# Patient Record
Sex: Male | Born: 1970 | Race: Black or African American | Hispanic: No | Marital: Single | Smoking: Never smoker
Health system: Southern US, Community
[De-identification: ages and names within clinical notes are randomized; demographics above are authoritative.]

## PROBLEM LIST (undated history)

## (undated) DIAGNOSIS — I10 Essential (primary) hypertension: Secondary | ICD-10-CM

## (undated) DIAGNOSIS — K859 Acute pancreatitis without necrosis or infection, unspecified: Secondary | ICD-10-CM

---

## 2000-08-20 ENCOUNTER — Emergency Department (HOSPITAL_COMMUNITY): Admission: EM | Admit: 2000-08-20 | Discharge: 2000-08-20 | Payer: Self-pay | Admitting: Emergency Medicine

## 2000-08-20 ENCOUNTER — Encounter: Payer: Self-pay | Admitting: Emergency Medicine

## 2000-11-08 ENCOUNTER — Emergency Department (HOSPITAL_COMMUNITY): Admission: EM | Admit: 2000-11-08 | Discharge: 2000-11-08 | Payer: Self-pay | Admitting: Emergency Medicine

## 2000-11-10 ENCOUNTER — Emergency Department (HOSPITAL_COMMUNITY): Admission: EM | Admit: 2000-11-10 | Discharge: 2000-11-11 | Payer: Self-pay | Admitting: Emergency Medicine

## 2001-01-06 ENCOUNTER — Emergency Department (HOSPITAL_COMMUNITY): Admission: EM | Admit: 2001-01-06 | Discharge: 2001-01-06 | Payer: Self-pay | Admitting: *Deleted

## 2002-01-08 ENCOUNTER — Emergency Department (HOSPITAL_COMMUNITY): Admission: EM | Admit: 2002-01-08 | Discharge: 2002-01-08 | Payer: Self-pay | Admitting: Emergency Medicine

## 2003-05-24 ENCOUNTER — Emergency Department (HOSPITAL_COMMUNITY): Admission: EM | Admit: 2003-05-24 | Discharge: 2003-05-24 | Payer: Self-pay | Admitting: Emergency Medicine

## 2005-10-05 ENCOUNTER — Emergency Department (HOSPITAL_COMMUNITY): Admission: EM | Admit: 2005-10-05 | Discharge: 2005-10-05 | Payer: Self-pay | Admitting: Emergency Medicine

## 2007-03-02 ENCOUNTER — Emergency Department (HOSPITAL_COMMUNITY): Admission: EM | Admit: 2007-03-02 | Discharge: 2007-03-02 | Payer: Self-pay | Admitting: Emergency Medicine

## 2007-08-25 ENCOUNTER — Emergency Department (HOSPITAL_COMMUNITY): Admission: EM | Admit: 2007-08-25 | Discharge: 2007-08-25 | Payer: Self-pay | Admitting: Emergency Medicine

## 2009-10-29 ENCOUNTER — Emergency Department (HOSPITAL_COMMUNITY): Admission: EM | Admit: 2009-10-29 | Discharge: 2009-10-29 | Payer: Self-pay | Admitting: Emergency Medicine

## 2011-01-01 LAB — HEPATIC FUNCTION PANEL
ALT: 26
AST: 24
Albumin: 4.2
Alkaline Phosphatase: 72
Indirect Bilirubin: 1.1 — ABNORMAL HIGH
Total Protein: 8

## 2011-01-01 LAB — CBC
Platelets: 218
RDW: 13.1
WBC: 8.2

## 2011-01-01 LAB — URINE MICROSCOPIC-ADD ON

## 2011-01-01 LAB — POCT I-STAT, CHEM 8
BUN: 11
Chloride: 99
Creatinine, Ser: 1.3
Glucose, Bld: 135 — ABNORMAL HIGH
HCT: 57 — ABNORMAL HIGH
Potassium: 4.5

## 2011-01-01 LAB — URINALYSIS, ROUTINE W REFLEX MICROSCOPIC
Specific Gravity, Urine: 1.037 — ABNORMAL HIGH
Urobilinogen, UA: 0.2
pH: 6

## 2011-01-01 LAB — DIFFERENTIAL
Basophils Absolute: 0
Lymphocytes Relative: 12
Lymphs Abs: 1
Neutro Abs: 6.9
Neutrophils Relative %: 84 — ABNORMAL HIGH

## 2012-05-17 ENCOUNTER — Emergency Department (HOSPITAL_COMMUNITY)
Admission: EM | Admit: 2012-05-17 | Discharge: 2012-05-18 | Disposition: A | Discharge status: Home / Self Care | Payer: Medicaid Other | Attending: Emergency Medicine | Admitting: Emergency Medicine

## 2012-05-17 DIAGNOSIS — R141 Gas pain: Secondary | ICD-10-CM | POA: Insufficient documentation

## 2012-05-17 DIAGNOSIS — R142 Eructation: Secondary | ICD-10-CM | POA: Insufficient documentation

## 2012-05-17 DIAGNOSIS — R110 Nausea: Secondary | ICD-10-CM | POA: Insufficient documentation

## 2012-05-17 DIAGNOSIS — K859 Acute pancreatitis without necrosis or infection, unspecified: Secondary | ICD-10-CM

## 2012-05-17 LAB — COMPREHENSIVE METABOLIC PANEL
ALT: 33 U/L (ref 0–53)
AST: 27 U/L (ref 0–37)
Albumin: 4 g/dL (ref 3.5–5.2)
Calcium: 8.9 mg/dL (ref 8.4–10.5)
Sodium: 135 mEq/L (ref 135–145)
Total Protein: 7.6 g/dL (ref 6.0–8.3)

## 2012-05-17 LAB — CBC WITH DIFFERENTIAL/PLATELET
Basophils Absolute: 0 10*3/uL (ref 0.0–0.1)
Eosinophils Absolute: 0.1 10*3/uL (ref 0.0–0.7)
Eosinophils Relative: 2 % (ref 0–5)
MCH: 27.9 pg (ref 26.0–34.0)
MCV: 85.9 fL (ref 78.0–100.0)
Platelets: 193 10*3/uL (ref 150–400)
RDW: 12.4 % (ref 11.5–15.5)
WBC: 6.9 10*3/uL (ref 4.0–10.5)

## 2012-05-17 MED ORDER — ONDANSETRON HCL 4 MG/2ML IJ SOLN
4.0000 mg | Freq: Once | INTRAMUSCULAR | Status: AC
  Administered 2012-05-17: 4 mg via INTRAVENOUS
  Filled 2012-05-17: qty 2

## 2012-05-17 MED ORDER — OXYCODONE-ACETAMINOPHEN 5-325 MG PO TABS: 1.0000 | ORAL_TABLET | Freq: Four times a day (QID) | ORAL | Status: DC | PRN

## 2012-05-17 MED ORDER — MORPHINE SULFATE 4 MG/ML IJ SOLN
4.0000 mg | Freq: Once | INTRAMUSCULAR | Status: AC
  Administered 2012-05-17: 4 mg via INTRAVENOUS
  Filled 2012-05-17: qty 1

## 2012-05-17 MED ORDER — PROMETHAZINE HCL 25 MG PO TABS: 12.5000 mg | ORAL_TABLET | Freq: Four times a day (QID) | ORAL | Status: DC | PRN

## 2012-05-17 MED ORDER — SODIUM CHLORIDE 0.9 % IV SOLN
Freq: Once | INTRAVENOUS | Status: AC
  Administered 2012-05-17: 22:00:00 via INTRAVENOUS

## 2012-05-17 NOTE — ED Provider Notes (Signed)
History     CSN: [id number]  Arrival date & time 05/17/12  2043   First MD Initiated Contact with Patient 05/17/12 2138      Chief Complaint  Patient presents with  . Abdominal Pain    (Consider location/radiation/quality/duration/timing/severity/associated sxs/prior treatment) HPI Comments: Patient reports that 7 years ago he was told he had an episode of pancreatitis, now with 2 weeks Hx of bilateral upper abdominal pina with nausea.  Has been taking Ibuprofen 1-2 times daily without relief.  Reports drink some form of alcohol on an 5-7 times a week basis.    Patient is a 42 y.o. male presenting with abdominal pain. The history is provided by the patient.  Abdominal Pain Pain location:  LUQ, RUQ and epigastric Pain quality: aching and fullness   Pain radiates to:  Does not radiate Pain severity:  Moderate Onset quality:  Gradual Duration:  2 weeks Timing:  Constant Progression:  Worsening Chronicity:  Recurrent Context: alcohol use   Context: not diet changes, not eating, not medication withdrawal and not retching   Relieved by:  Nothing Worsened by:  Position changes and eating Ineffective treatments:  NSAIDs Associated symptoms: nausea   Associated symptoms: no chest pain, no chills, no constipation, no cough, no diarrhea, no dysuria, no fever, no shortness of breath and no vomiting   Risk factors: alcohol abuse and obesity     No past medical history on file.  No past surgical history on file.  No family history on file.  History  Substance Use Topics  . Smoking status: Not on file  . Smokeless tobacco: Not on file  . Alcohol Use: Not on file      Review of Systems  Constitutional: Negative for fever and chills.  Respiratory: Negative for cough and shortness of breath.   Cardiovascular: Negative for chest pain and leg swelling.  Gastrointestinal: Positive for nausea, abdominal pain and abdominal distention. Negative for vomiting, diarrhea, constipation  and blood in stool.  Genitourinary: Negative for dysuria and flank pain.  Skin: Negative for rash.  Neurological: Negative for weakness.  All other systems reviewed and are negative.    Allergies  Review of patient's allergies indicates no known allergies.  Home Medications   Current Outpatient Rx  Name  Route  Sig  Dispense  Refill  . oxyCODONE-acetaminophen (PERCOCET/ROXICET) 5-325 MG per tablet   Oral   Take 1-2 tablets by mouth every 6 (six) hours as needed for pain.   20 tablet   0   . promethazine (PHENERGAN) 25 MG tablet   Oral   Take 0.5 tablets (12.5 mg total) by mouth every 6 (six) hours as needed for nausea.   30 tablet   0     BP 157/79  Pulse 70  Temp(Src) 98.1 F (36.7 C) (Oral)  Resp 20  SpO2 95%  Physical Exam  Constitutional: He is oriented to person, place, and time. He appears well-developed and well-nourished.  HENT:  Head: Normocephalic.  Eyes: Pupils are equal, round, and reactive to light.  Neck: Normal range of motion.  Cardiovascular: Normal rate.   Pulmonary/Chest: Effort normal and breath sounds normal. No respiratory distress.  Abdominal: Soft. Bowel sounds are normal. He exhibits no distension. There is tenderness in the right upper quadrant, epigastric area and left upper quadrant. There is no rigidity, no rebound and no guarding.  Musculoskeletal: Normal range of motion.  Neurological: He is alert and oriented to person, place, and time.  Skin: Skin is warm.  No rash noted. No pallor.    ED Course  Procedures (including critical care time)  Labs Reviewed  COMPREHENSIVE METABOLIC PANEL - Abnormal; Notable for the following:    Glucose, Bld 135 (*)    All other components within normal limits  LIPASE, BLOOD - Abnormal; Notable for the following:    Lipase 1705 (*)    All other components within normal limits  CBC WITH DIFFERENTIAL  ETHANOL  URINE RAPID DRUG SCREEN (HOSP PERFORMED)  URINALYSIS, ROUTINE W REFLEX MICROSCOPIC    No results found.   1. Pancreatitis       MDM   Patient refusing admission would prefer to try home therapies with the understanding that if he does not improve he will return for admission        Arman Filter, NP 05/17/12 2308

## 2012-05-17 NOTE — ED Notes (Signed)
Pt c/o abd pain days x4. Pt sts his "pancreas is swelling".VSS

## 2012-05-22 NOTE — ED Provider Notes (Signed)
Medical screening examination/treatment/procedure(s) were performed by non-physician practitioner and as supervising physician I was immediately available for consultation/collaboration.  Jones Skene, M.D.     Jones Skene, MD 05/22/12 [phone]

## 2013-03-08 ENCOUNTER — Emergency Department (HOSPITAL_COMMUNITY): Payer: Medicaid Other

## 2013-03-08 ENCOUNTER — Emergency Department (HOSPITAL_COMMUNITY)
Admission: EM | Admit: 2013-03-08 | Discharge: 2013-03-08 | Disposition: A | Discharge status: Home / Self Care | Payer: Medicaid Other | Attending: Emergency Medicine | Admitting: Emergency Medicine

## 2013-03-08 ENCOUNTER — Encounter (HOSPITAL_COMMUNITY): Payer: Self-pay | Admitting: Emergency Medicine

## 2013-03-08 DIAGNOSIS — K859 Acute pancreatitis without necrosis or infection, unspecified: Secondary | ICD-10-CM | POA: Insufficient documentation

## 2013-03-08 DIAGNOSIS — M549 Dorsalgia, unspecified: Secondary | ICD-10-CM | POA: Insufficient documentation

## 2013-03-08 HISTORY — DX: Acute pancreatitis without necrosis or infection, unspecified: K85.90

## 2013-03-08 LAB — CBC WITH DIFFERENTIAL/PLATELET
Eosinophils Relative: 5 % (ref 0–5)
HCT: 39.1 % (ref 39.0–52.0)
Lymphocytes Relative: 23 % (ref 12–46)
Lymphs Abs: 1.4 10*3/uL (ref 0.7–4.0)
MCV: 83.4 fL (ref 78.0–100.0)
Monocytes Absolute: 0.8 10*3/uL (ref 0.1–1.0)
Platelets: 206 10*3/uL (ref 150–400)
RBC: 4.69 MIL/uL (ref 4.22–5.81)
WBC: 6.4 10*3/uL (ref 4.0–10.5)

## 2013-03-08 LAB — URINALYSIS, ROUTINE W REFLEX MICROSCOPIC
Hgb urine dipstick: NEGATIVE
Ketones, ur: NEGATIVE mg/dL
Protein, ur: 100 mg/dL — AB
Urobilinogen, UA: 1 mg/dL (ref 0.0–1.0)

## 2013-03-08 LAB — COMPREHENSIVE METABOLIC PANEL
ALT: 17 U/L (ref 0–53)
CO2: 28 mEq/L (ref 19–32)
Calcium: 9.7 mg/dL (ref 8.4–10.5)
GFR calc Af Amer: >90 mL/min (ref >90)
GFR calc non Af Amer: >90 mL/min (ref >90)
Glucose, Bld: 112 mg/dL — ABNORMAL HIGH (ref 70–99)
Sodium: 134 mEq/L — ABNORMAL LOW (ref 135–145)

## 2013-03-08 LAB — URINE MICROSCOPIC-ADD ON

## 2013-03-08 MED ORDER — SODIUM CHLORIDE 0.9 % IV BOLUS (SEPSIS)
1000.0000 mL | Freq: Once | INTRAVENOUS | Status: AC
  Administered 2013-03-08: 1000 mL via INTRAVENOUS

## 2013-03-08 MED ORDER — HYDROMORPHONE HCL PF 1 MG/ML IJ SOLN
1.0000 mg | Freq: Once | INTRAMUSCULAR | Status: AC
  Administered 2013-03-08: 1 mg via INTRAVENOUS
  Filled 2013-03-08: qty 1

## 2013-03-08 MED ORDER — IOHEXOL 300 MG/ML  SOLN
100.0000 mL | Freq: Once | INTRAMUSCULAR | Status: AC | PRN
  Administered 2013-03-08: 100 mL via INTRAVENOUS

## 2013-03-08 MED ORDER — IOHEXOL 300 MG/ML  SOLN
50.0000 mL | Freq: Once | INTRAMUSCULAR | Status: AC | PRN
  Administered 2013-03-08: 50 mL via ORAL

## 2013-03-08 MED ORDER — ONDANSETRON HCL 4 MG/2ML IJ SOLN
4.0000 mg | Freq: Once | INTRAMUSCULAR | Status: AC
  Administered 2013-03-08: 4 mg via INTRAVENOUS
  Filled 2013-03-08: qty 2

## 2013-03-08 MED ORDER — OXYCODONE-ACETAMINOPHEN 5-325 MG PO TABS
1.0000 | ORAL_TABLET | Freq: Once | ORAL | Status: AC
  Administered 2013-03-08: 1 via ORAL
  Filled 2013-03-08: qty 1

## 2013-03-08 MED ORDER — OXYCODONE-ACETAMINOPHEN 5-325 MG PO TABS: 1.0000 | ORAL_TABLET | Freq: Four times a day (QID) | ORAL | Status: DC | PRN

## 2013-03-08 NOTE — ED Notes (Signed)
Pt c/o abd pain since Thursday; c/o constipation; last bowel movement Wednesday

## 2013-03-08 NOTE — ED Notes (Signed)
Patient transported to CT 

## 2013-03-08 NOTE — ED Provider Notes (Signed)
CSN: [id number]     Arrival date & time 03/08/13  0414 History   First MD Initiated Contact with Patient 03/08/13 0601     Chief Complaint  Patient presents with  . Abdominal Pain   (Consider location/radiation/quality/duration/timing/severity/associated sxs/prior Treatment) HPI Comments: Patient with history of pancreatitis presents with a four-day history of upper abdominal pain with radiation to his back. The symptoms began gradually and have worsened. Patient has had decreased appetite but has not had fever, vomiting, or diarrhea. He does complain of constipation with his last bowel movement being 5 days ago. Patient has not taken anything for this besides some leftover pain pills he had from a dental procedure. He's been drinking fluids but states he has not felt like eating solid foods. She denies heavy NSAID use. Patient drinks alcohol but states that he has not over the past week. He denies any abdominal surgeries. Course is constant. Nothing makes symptoms better.  Patient is a 42 y.o. male presenting with abdominal pain. The history is provided by the patient.  Abdominal Pain Associated symptoms: nausea   Associated symptoms: no chest pain, no cough, no diarrhea, no dysuria, no fever, no sore throat and no vomiting     Past Medical History  Diagnosis Date  . Pancreatitis    History reviewed. No pertinent past surgical history. No family history on file. History  Substance Use Topics  . Smoking status: Never Smoker   . Smokeless tobacco: Not on file  . Alcohol Use: Yes    Review of Systems  Constitutional: Negative for fever.  HENT: Negative for rhinorrhea and sore throat.   Eyes: Negative for redness.  Respiratory: Negative for cough.   Cardiovascular: Negative for chest pain.  Gastrointestinal: Positive for nausea and abdominal pain. Negative for vomiting and diarrhea.  Genitourinary: Negative for dysuria.  Musculoskeletal: Positive for back pain. Negative for  myalgias.  Skin: Negative for rash.  Neurological: Negative for headaches.    Allergies  Review of patient's allergies indicates no known allergies.  Home Medications   Current Outpatient Rx  Name  Route  Sig  Dispense  Refill  . oxyCODONE-acetaminophen (PERCOCET/ROXICET) 5-325 MG per tablet   Oral   Take 1-2 tablets by mouth every 6 (six) hours as needed for severe pain.   20 tablet   0    BP 159/77  Pulse 80  Temp(Src) 97.5 F (36.4 C)  Resp 20  SpO2 98% Physical Exam  Nursing note and vitals reviewed. Constitutional: He appears well-developed and well-nourished.  HENT:  Head: Normocephalic and atraumatic.  Eyes: Conjunctivae are normal. Right eye exhibits no discharge. Left eye exhibits no discharge.  Neck: Normal range of motion. Neck supple.  Cardiovascular: Normal rate, regular rhythm and normal heart sounds.   Pulmonary/Chest: Effort normal and breath sounds normal.  Abdominal: Soft. Bowel sounds are normal. There is tenderness in the right upper quadrant, epigastric area, periumbilical area and left upper quadrant. There is no rigidity, no rebound (moderate), no guarding, no CVA tenderness, no tenderness at McBurney's point and negative Murphy's sign.  Musculoskeletal: He exhibits no edema and no tenderness.  Neurological: He is alert.  Skin: Skin is warm and dry.  Psychiatric: He has a normal mood and affect.    ED Course  Procedures (including critical care time) Labs Review Labs Reviewed  COMPREHENSIVE METABOLIC PANEL - Abnormal; Notable for the following:    Sodium 134 (*)    Potassium 3.2 (*)    Glucose, Bld 112 (*)  All other components within normal limits  LIPASE, BLOOD - Abnormal; Notable for the following:    Lipase >3000 (*)    All other components within normal limits  URINALYSIS, ROUTINE W REFLEX MICROSCOPIC - Abnormal; Notable for the following:    Color, Urine AMBER (*)    Specific Gravity, Urine >1.046 (*)    Bilirubin Urine SMALL (*)     Protein, ur 100 (*)    All other components within normal limits  URINE MICROSCOPIC-ADD ON - Abnormal; Notable for the following:    Bacteria, UA FEW (*)    All other components within normal limits  CBC WITH DIFFERENTIAL   Imaging Review Dg Abd Acute W/chest  03/08/2013   CLINICAL DATA:  Midabdominal pain  EXAM: ACUTE ABDOMEN SERIES (ABDOMEN 2 VIEW & CHEST 1 VIEW)  COMPARISON:  08/25/2007  FINDINGS: Linear lung base opacities. Heart size and mediastinal contours within normal range.  No free intraperitoneal air. Nonobstructive bowel gas pattern. Moderate stool burden. No acute osseous finding.  IMPRESSION: Nonobstructive bowel gas pattern.  Linear lung base opacities, favor atelectasis.   Electronically Signed   By: Jearld Lesch M.D.   On: 03/08/2013 06:02    EKG Interpretation   None      6:00 AM Patient seen and examined. Work-up reviewed. Medications ordered. Pt is uncomfortable but does not want to stay in the hospital. We will focus on getting him to feel better and reevaluate.   Vital signs reviewed and are as follows: Filed Vitals:   03/08/13 0421  BP: 159/77  Pulse: 80  Temp: 97.5 F (36.4 C)  Resp: 20   6:54 AM Did not obtain LDH but would still be considered low risk with LDH > 350 (per Ranson criteria) if this were elevated given other negative risk factors.   10:42 AM Patient d/w and seen by Dr. Jeraldine Loots. Patient has received >3L IV fluids. He is tolerating PO's. CT ordered, reviewed by myself. Demonstrates acute pancreatitis without complicating features. Patient's pain improved. I gave option of admission. He still wants to go home. He has popsicles and knows he needs to adhere to liquid diet. Stressed that he needs to return with worsening pain, fever, vomiting, diarrhea, other concerns. Patient verbalizes understanding and agrees with plan.   Patient counseled on use of narcotic pain medications. Counseled not to combine these medications with others containing  tylenol. Urged not to drink alcohol, drive, or perform any other activities that requires focus while taking these medications. The patient verbalizes understanding and agrees with the plan.    MDM   1. Acute pancreatitis    Patient with acute pancreatitis, abd pain without vomiting, CT does not demonstrate any complicating features -- however lipase > 3000. Low risk per Ranson criteria. Patient offered admission but does not want to stay. Appropriate instructions given with return if worsening or if he changes mind. Overall he appears very well.     Renne Crigler, PA-C 03/08/13 1320

## 2013-03-08 NOTE — ED Provider Notes (Signed)
  This was a shared visit with a mid-level provided (NP or PA).  Throughout the patient's course I was available for consultation/collaboration.   On my exam the patient was in no distress, but he was in pain.  This improved following analgesics, fluids.  He voiced strong preference for discharge.  With reassuring CT scan, this seems reasonable, though he was provided explicit return precautions.      Gerhard Munch, MD 03/08/13 [phone]

## 2013-03-10 ENCOUNTER — Encounter (HOSPITAL_COMMUNITY): Payer: Self-pay | Admitting: Emergency Medicine

## 2013-03-10 ENCOUNTER — Emergency Department (HOSPITAL_COMMUNITY)
Admission: EM | Admit: 2013-03-10 | Discharge: 2013-03-11 | Disposition: A | Discharge status: Home / Self Care | Payer: Medicaid Other | Attending: Emergency Medicine | Admitting: Emergency Medicine

## 2013-03-10 DIAGNOSIS — K5900 Constipation, unspecified: Secondary | ICD-10-CM | POA: Insufficient documentation

## 2013-03-10 DIAGNOSIS — K859 Acute pancreatitis without necrosis or infection, unspecified: Secondary | ICD-10-CM

## 2013-03-10 LAB — ETHANOL: Alcohol, Ethyl (B): <11 mg/dL (ref 0–11)

## 2013-03-10 LAB — CBC WITH DIFFERENTIAL/PLATELET
Basophils Absolute: 0 10*3/uL (ref 0.0–0.1)
Basophils Relative: 0 % (ref 0–1)
Eosinophils Absolute: 0.3 10*3/uL (ref 0.0–0.7)
HCT: 40.8 % (ref 39.0–52.0)
Hemoglobin: 13.9 g/dL (ref 13.0–17.0)
MCH: 28.1 pg (ref 26.0–34.0)
MCHC: 34.1 g/dL (ref 30.0–36.0)
Monocytes Absolute: 0.7 10*3/uL (ref 0.1–1.0)
Monocytes Relative: 13 % — ABNORMAL HIGH (ref 3–12)

## 2013-03-10 LAB — COMPREHENSIVE METABOLIC PANEL
ALT: 26 U/L (ref 0–53)
AST: 26 U/L (ref 0–37)
Alkaline Phosphatase: 161 U/L — ABNORMAL HIGH (ref 39–117)
CO2: 30 mEq/L (ref 19–32)
Calcium: 10.4 mg/dL (ref 8.4–10.5)
GFR calc non Af Amer: >90 mL/min (ref >90)
Potassium: 4 mEq/L (ref 3.5–5.1)
Sodium: 137 mEq/L (ref 135–145)

## 2013-03-10 MED ORDER — ONDANSETRON HCL 4 MG/2ML IJ SOLN
4.0000 mg | Freq: Once | INTRAMUSCULAR | Status: AC
  Administered 2013-03-11: 4 mg via INTRAVENOUS
  Filled 2013-03-10: qty 2

## 2013-03-10 MED ORDER — SODIUM CHLORIDE 0.9 % IV SOLN
INTRAVENOUS | Status: DC
  Administered 2013-03-10: 200 mL/h via INTRAVENOUS

## 2013-03-10 MED ORDER — HYDROMORPHONE HCL PF 1 MG/ML IJ SOLN
1.0000 mg | Freq: Once | INTRAMUSCULAR | Status: AC
  Administered 2013-03-11: 1 mg via INTRAVENOUS
  Filled 2013-03-10: qty 1

## 2013-03-10 NOTE — ED Notes (Signed)
Patient is alert and oriented x3.  He is complaining of lower abdominal pain with lower back pain. He was seen at [facility] earlier this week for pancreatitis.  He was given a prescription for oxycodone  And states that he has not had any relief.  Currently he rates his pain 9 of 10 without nausea or vomiting.  He adds that he has not had a BM since 11/26
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
## 2013-03-11 LAB — URINE MICROSCOPIC-ADD ON

## 2013-03-11 LAB — URINALYSIS, ROUTINE W REFLEX MICROSCOPIC
Glucose, UA: NEGATIVE mg/dL
Hgb urine dipstick: NEGATIVE
Ketones, ur: 15 mg/dL — AB
Nitrite: NEGATIVE
Protein, ur: 30 mg/dL — AB
Specific Gravity, Urine: 1.044 — ABNORMAL HIGH (ref 1.005–1.030)
Urobilinogen, UA: 1 mg/dL (ref 0.0–1.0)

## 2013-03-11 LAB — LACTATE DEHYDROGENASE: LDH: 210 U/L (ref 94–250)

## 2013-03-11 MED ORDER — SODIUM CHLORIDE 0.9 % IV BOLUS (SEPSIS)
1000.0000 mL | Freq: Once | INTRAVENOUS | Status: AC
  Administered 2013-03-11: 1000 mL via INTRAVENOUS

## 2013-03-11 MED ORDER — HYDROMORPHONE HCL 4 MG PO TABS: 2.0000 mg | ORAL_TABLET | ORAL | Status: DC | PRN

## 2013-03-11 MED ORDER — ONDANSETRON 8 MG PO TBDP: 8.0000 mg | ORAL_TABLET | Freq: Three times a day (TID) | ORAL | Status: DC | PRN

## 2013-03-11 MED ORDER — HYDROMORPHONE HCL PF 1 MG/ML IJ SOLN
1.0000 mg | Freq: Once | INTRAMUSCULAR | Status: AC
  Administered 2013-03-11: 1 mg via INTRAVENOUS
  Filled 2013-03-11: qty 1

## 2015-07-15 ENCOUNTER — Encounter (HOSPITAL_COMMUNITY): Payer: Self-pay

## 2015-07-15 ENCOUNTER — Emergency Department (HOSPITAL_COMMUNITY)
Admission: EM | Admit: 2015-07-15 | Discharge: 2015-07-15 | Disposition: A | Discharge status: Home / Self Care | Payer: No Typology Code available for payment source | Attending: Emergency Medicine | Admitting: Emergency Medicine

## 2015-07-15 ENCOUNTER — Emergency Department (HOSPITAL_COMMUNITY)
Admission: EM | Admit: 2015-07-15 | Discharge: 2015-07-15 | Disposition: A | Discharge status: Home / Self Care | Payer: No Typology Code available for payment source | Source: Home / Self Care | Attending: Emergency Medicine | Admitting: Emergency Medicine

## 2015-07-15 DIAGNOSIS — M25562 Pain in left knee: Secondary | ICD-10-CM

## 2015-07-15 DIAGNOSIS — Z8719 Personal history of other diseases of the digestive system: Secondary | ICD-10-CM

## 2015-07-15 DIAGNOSIS — S8992XA Unspecified injury of left lower leg, initial encounter: Secondary | ICD-10-CM | POA: Insufficient documentation

## 2015-07-15 DIAGNOSIS — Y92410 Unspecified street and highway as the place of occurrence of the external cause: Secondary | ICD-10-CM | POA: Insufficient documentation

## 2015-07-15 DIAGNOSIS — Y9389 Activity, other specified: Secondary | ICD-10-CM | POA: Insufficient documentation

## 2015-07-15 DIAGNOSIS — S3992XA Unspecified injury of lower back, initial encounter: Secondary | ICD-10-CM | POA: Diagnosis not present

## 2015-07-15 DIAGNOSIS — M545 Low back pain, unspecified: Secondary | ICD-10-CM

## 2015-07-15 DIAGNOSIS — Y998 Other external cause status: Secondary | ICD-10-CM

## 2015-07-15 DIAGNOSIS — R202 Paresthesia of skin: Secondary | ICD-10-CM

## 2015-07-15 DIAGNOSIS — R200 Anesthesia of skin: Secondary | ICD-10-CM

## 2015-07-15 MED ORDER — METHOCARBAMOL 500 MG PO TABS: 500.0000 mg | ORAL_TABLET | Freq: Two times a day (BID) | ORAL | Status: DC

## 2015-07-15 MED ORDER — IBUPROFEN 800 MG PO TABS
800.0000 mg | ORAL_TABLET | Freq: Once | ORAL | Status: AC
  Administered 2015-07-15: 800 mg via ORAL
  Filled 2015-07-15: qty 1

## 2015-07-15 MED ORDER — IBUPROFEN 800 MG PO TABS: 800.0000 mg | ORAL_TABLET | Freq: Three times a day (TID) | ORAL | Status: DC

## 2015-07-15 MED ORDER — METHOCARBAMOL 500 MG PO TABS
500.0000 mg | ORAL_TABLET | Freq: Once | ORAL | Status: AC
  Administered 2015-07-15: 500 mg via ORAL
  Filled 2015-07-15: qty 1
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
## 2015-07-15 NOTE — Discharge Instructions (Signed)
Paresthesia Paresthesia is an abnormal burning or prickling sensation. This sensation is generally felt in the hands, arms, legs, or feet. However, it may occur in any part of the body. Usually, it is not painful. The feeling may be described as:  Tingling or numbness.  Pins and needles.  Skin crawling.  Buzzing.  Limbs falling asleep.  Itching. Most people experience temporary (transient) paresthesia at some time in their lives. Paresthesia may occur when you breathe too quickly (hyperventilation). It can also occur without any apparent cause. Commonly, paresthesia occurs when pressure is placed on a nerve. The sensation quickly goes away after the pressure is removed. For some people, however, paresthesia is a long-lasting (chronic) condition that is caused by an underlying disorder. If you continue to have paresthesia, you may need further medical evaluation. HOME CARE INSTRUCTIONS Watch your condition for any changes. Taking the following actions may help to lessen any discomfort that you are feeling:  Avoid drinking alcohol.  Try acupuncture or massage to help relieve your symptoms.  Keep all follow-up visits as directed by your health care provider. This is important. SEEK MEDICAL CARE IF:  You continue to have episodes of paresthesia.  Your burning or prickling feeling gets worse when you walk.  You have pain, cramps, or dizziness.  You develop a rash. SEEK IMMEDIATE MEDICAL CARE IF:  You feel weak.  You have trouble walking or moving.  You have problems with speech, understanding, or vision.  You feel confused.  You cannot control your bladder or bowel movements.  You have numbness after an injury.  You faint.   This information is not intended to replace advice given to you by your health care provider. Make sure you discuss any questions you have with your health care provider.   Document Released: 03/14/2002 Document Revised: 08/08/2014 Document Reviewed:  03/20/2014 Elsevier Interactive Patient Education 2016 Elsevier Inc.  
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
## 2016-02-22 ENCOUNTER — Emergency Department (HOSPITAL_COMMUNITY)
Admission: EM | Admit: 2016-02-22 | Discharge: 2016-02-22 | Disposition: A | Discharge status: Home / Self Care | Payer: No Typology Code available for payment source | Attending: Physician Assistant | Admitting: Physician Assistant

## 2016-02-22 ENCOUNTER — Encounter (HOSPITAL_COMMUNITY): Payer: Self-pay

## 2016-02-22 DIAGNOSIS — R109 Unspecified abdominal pain: Secondary | ICD-10-CM | POA: Diagnosis not present

## 2016-02-22 DIAGNOSIS — Z79899 Other long term (current) drug therapy: Secondary | ICD-10-CM | POA: Diagnosis not present

## 2016-02-22 DIAGNOSIS — Y999 Unspecified external cause status: Secondary | ICD-10-CM | POA: Insufficient documentation

## 2016-02-22 DIAGNOSIS — Y939 Activity, unspecified: Secondary | ICD-10-CM | POA: Diagnosis not present

## 2016-02-22 DIAGNOSIS — Y92410 Unspecified street and highway as the place of occurrence of the external cause: Secondary | ICD-10-CM | POA: Insufficient documentation

## 2016-02-22 DIAGNOSIS — M7918 Myalgia, other site: Secondary | ICD-10-CM

## 2016-02-22 DIAGNOSIS — M542 Cervicalgia: Secondary | ICD-10-CM | POA: Diagnosis present

## 2016-02-22 MED ORDER — METHOCARBAMOL 500 MG PO TABS: ORAL_TABLET | ORAL | 0 refills | Status: DC
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
## 2016-02-22 NOTE — Discharge Instructions (Signed)
For pain control please take ibuprofen (also known as Motrin or Advil) 800mg (this is normally 4 over the counter pills) 3 times a day  for 5 days. Take with food to minimize stomach irritation. ° °For breakthrough pain you may take Robaxin. Do not drink alcohol, drive or operate heavy machinery when taking Robaxin. ° °Do not hesitate to return to the emergency room for any new, worsening or concerning symptoms. ° °Please obtain primary care using resource guide below. Let them know that you were seen in the emergency room and that they will need to obtain records for further outpatient management. ° ° ° °
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
## 2016-07-26 ENCOUNTER — Encounter (HOSPITAL_COMMUNITY): Payer: Self-pay | Admitting: Emergency Medicine

## 2016-07-26 ENCOUNTER — Emergency Department (HOSPITAL_COMMUNITY)
Admission: EM | Admit: 2016-07-26 | Discharge: 2016-07-26 | Disposition: A | Discharge status: Home / Self Care | Payer: Commercial Managed Care - PPO | Attending: Emergency Medicine | Admitting: Emergency Medicine

## 2016-07-26 DIAGNOSIS — J029 Acute pharyngitis, unspecified: Secondary | ICD-10-CM | POA: Insufficient documentation

## 2016-07-26 DIAGNOSIS — Z79899 Other long term (current) drug therapy: Secondary | ICD-10-CM | POA: Insufficient documentation

## 2016-07-26 LAB — RAPID STREP SCREEN (MED CTR MEBANE ONLY): Streptococcus, Group A Screen (Direct): NEGATIVE

## 2016-07-26 NOTE — ED Triage Notes (Signed)
Patient here from home with complaints of sore throat x2 weeks. Reports difficulty swallowing. Congestion, sneezing.

## 2016-07-26 NOTE — ED Provider Notes (Signed)
[facility]-EMERGENCY DEPT Provider Note   CSN: [id number] Arrival date & time: 07/26/16  1621  By signing my name below, I, Sonum Patel, attest that this documentation has been prepared under the direction and in the presence of [employer]. Electronically Signed: Sonum Patel, Neurosurgeon. 07/26/16. 4:58 PM.   Charting assumed from Leone Payor at 5:03PM by Rosario Adie.  By signing my name below, I, Rosario Adie, attest that this documentation has been prepared under the direction and in the presence of [facility]. Electronically Signed: Rosario Adie, ED Scribe 07/26/16. 5:05 PM.  History   Chief Complaint Chief Complaint  Patient presents with  . Sore Throat   The history is provided by the patient. No language interpreter was used.    HPI Comments: Darryl Elliott is a 46 y.o. male who presents to the Emergency Department complaining of 2 weeks of a gradual onset, constant moderate sore throat that worsened this past week. He has associated nasal congestion and chills. He has been able to eat and drink normally. He has tried ibuprofen, saltwater gargle, honey, and throat sprays with transient relief. He denies fever, drooling, difficulty swallowing, CP, SOB, abdominal pain, nausea, vomiting, diarrhea.   Past Medical History:  Diagnosis Date  . Pancreatitis    There are no active problems to display for this patient.  History reviewed. No pertinent surgical history.  Home Medications    Prior to Admission medications   Medication Sig Start Date End Date Taking? Authorizing Provider  HYDROmorphone (DILAUDID) 4 MG tablet Take 0.5-1 tablets (2-4 mg total) by mouth every 4 (four) hours as needed (for pain). 03/11/13   John Molpus, MD  ibuprofen (ADVIL,MOTRIN) 800 MG tablet Take 1 tablet (800 mg total) by mouth 3 (three) times daily. 07/15/15   Stevi Barrett, PA-C  methocarbamol (ROBAXIN) 500 MG tablet Can take up to 1-2 tabs every 6 hours PRN PAIN 02/22/16    Joni Reining Pisciotta, PA-C  ondansetron (ZOFRAN ODT) 8 MG disintegrating tablet Take 1 tablet (8 mg total) by mouth every 8 (eight) hours as needed for nausea or vomiting.  ODT q4 hours prn nausea 03/11/13   Paula Libra, MD   Family History No family history on file.  Social History Social History  Substance Use Topics  . Smoking status: Never Smoker  . Smokeless tobacco: Never Used  . Alcohol use Yes   Allergies   Patient has no known allergies.  Review of Systems Review of Systems  Constitutional: Positive for chills. Negative for fever.  HENT: Positive for congestion and sore throat. Negative for drooling and trouble swallowing.   Respiratory: Negative for shortness of breath.   Cardiovascular: Negative for chest pain.  Gastrointestinal: Negative for abdominal pain, diarrhea, nausea and vomiting.  Genitourinary: Negative for difficulty urinating and dysuria.  Musculoskeletal: Negative for back pain and neck pain.  Neurological: Negative for weakness, numbness and headaches.   Physical Exam Updated Vital Signs BP (!) 164/90 (BP Location: Right Arm)   Pulse 70   Temp 97.5 F (36.4 C) (Oral)   Resp 18   SpO2 99%   Physical Exam  Constitutional: He is oriented to person, place, and time. He appears well-developed and well-nourished. No distress.  HENT:  Head: Normocephalic and atraumatic.  Posterior oropharynx erythematous with tonsillar hypertrophy bilaterally. No exudates, or uvular deviation. TMs normal bilaterally. Nasal septum is midline. Nose with pink nasal mucosa.   Eyes: Conjunctivae are normal. Right eye exhibits no discharge. Left eye exhibits  no discharge. No scleral icterus.  Neck: Neck supple. No JVD present. No tracheal deviation present. No thyromegaly present.  Cardiovascular: Normal rate, regular rhythm and normal heart sounds.   Pulmonary/Chest: Effort normal and breath sounds normal.  Abdominal: He exhibits no distension.  Musculoskeletal: Normal range  of motion.  Moves extremities spontaneously  Lymphadenopathy:    He has no cervical adenopathy.  Neurological: He is alert and oriented to person, place, and time.  Skin: Skin is warm and dry. He is not diaphoretic.  Psychiatric: He has a normal mood and affect.  Nursing note and vitals reviewed.  ED Treatments / Results  DIAGNOSTIC STUDIES: Oxygen Saturation is 99% on RA, normal by my interpretation.    COORDINATION OF CARE: 4:55 PM Discussed treatment plan with pt at bedside and pt agreed to plan.   Labs (all labs ordered are listed, but only abnormal results are displayed) Labs Reviewed  RAPID STREP SCREEN (NOT AT [facility])  [facility]   Procedures Procedures   Medications Ordered in ED Medications - No data to display  Initial Impression / Assessment and Plan / ED Course  I have reviewed the triage vital signs and the nursing notes.  Pertinent labs & imaging results that were available during my care of the patient were reviewed by me and considered in my medical decision making (see chart for details).     Pt with negative rapid strep. Afebrile, VS baseline for pt. Discussed f/u with PCP for re-evaluation of hypertension and understands and agrees. Diagnosis of viral pharyngitis. No abx indicated at this time. Discussed that results of strep culture are pending and patient will be informed if positive result and abx will be called in at that time. Discharge with symptomatic tx. No evidence of dehydration. Pt is tolerating secretions. Presentation not concerning for peritonsillar abscess or spread of infection to deep spaces of the throat; patent airway. Specific return precautions discussed. Recommended PCP follow up. Pt verbalized understanding of and agreement with plan and is safe for discharge home at this time.   Final Clinical Impressions(s) / ED Diagnoses   Final diagnoses:  Acute pharyngitis, unspecified etiology   New Prescriptions New  Prescriptions   No medications on file   I personally performed the services described in this documentation, which was scribed in my presence. The recorded information has been reviewed and is accurate.    Jeanie Sewer, PA-C 07/27/16 2053    Lyndal Pulley, MD 07/28/16 [phone]

## 2016-07-28 LAB — CULTURE, GROUP A STREP (THRC)

## 2016-07-29 ENCOUNTER — Telehealth: Payer: Self-pay | Admitting: Emergency Medicine

## 2016-07-29 NOTE — Progress Notes (Signed)
ED Antimicrobial Stewardship Positive Culture Follow Up   Darryl Elliott is an 46 y.o. male who presented to [facility] on 07/26/2016 with a chief complaint of  Chief Complaint  Patient presents with  . Sore Throat    Recent Results (from the past 720 hour(s))  Rapid strep screen     Status: None   Collection Time: 07/26/16  4:25 PM  Result Value Ref Range Status   Streptococcus, Group A Screen (Direct) NEGATIVE NEGATIVE Final    Comment: (NOTE) A Rapid Antigen test may result negative if the antigen level in the sample is below the detection level of this test. The FDA has not cleared this test as a stand-alone test therefore the rapid antigen negative result has reflexed to a Group A Strep culture.   Culture, group A strep     Status: None   Collection Time: 07/26/16  4:25 PM  Result Value Ref Range Status   Specimen Description THROAT  Final   Special Requests NONE Reflexed from [id number]  Final   Culture MODERATE GROUP A STREP (S.PYOGENES) ISOLATED  Final   Report Status 07/28/2016 FINAL  Final      Patient discharged originally without antimicrobial agent and treatment is now indicated  New antibiotic prescription: Amoxicillin 500 mg PO BID x 10 days.   ED Provider: Graciella Freer, PA-C  Oda Cogan, PharmD Candidate 07/29/2016, 9:50 AM

## 2016-07-29 NOTE — Telephone Encounter (Signed)
Post ED Visit - Positive Culture Follow-up: Successful Patient Follow-Up  Culture assessed and recommendations reviewed by:  Enzo Bi, Pharm.D.  Celedonio Miyamoto, Pharm.D., BCPS AQ-ID  Garvin Fila, Pharm.D., BCPS  Georgina Pillion, Pharm.D., BCPS  [place], [address].D., BCPS, AAHIVP  Estella Husk, Pharm.D., BCPS, AAHIVP  Lysle Pearl, PharmD, BCPS  Casilda Carls, PharmD, BCPS  Pollyann Samples, PharmD, BCPS  Positive strep culture   Patient discharged without antimicrobial prescription and treatment is now indicated  Organism is resistant to prescribed ED discharge antimicrobial  Patient with positive blood cultures  Changes discussed with ED provider: Graciella Freer PA New antibiotic prescription start Amoxicillin  po bid x 10 days  Attempting to contact patient   Berle Mull 07/29/2016, 10:30 AM

## 2016-10-07 ENCOUNTER — Telehealth: Payer: Self-pay | Admitting: Emergency Medicine

## 2016-10-07 NOTE — Telephone Encounter (Signed)
LOST TO FOLLOWUP 

## 2017-02-10 ENCOUNTER — Emergency Department (HOSPITAL_COMMUNITY)
Admission: EM | Admit: 2017-02-10 | Discharge: 2017-02-10 | Disposition: A | Discharge status: Home / Self Care | Payer: Commercial Managed Care - PPO | Attending: Physician Assistant | Admitting: Physician Assistant

## 2017-02-10 DIAGNOSIS — I10 Essential (primary) hypertension: Secondary | ICD-10-CM

## 2017-02-10 DIAGNOSIS — H65192 Other acute nonsuppurative otitis media, left ear: Secondary | ICD-10-CM | POA: Diagnosis not present

## 2017-02-10 DIAGNOSIS — H9202 Otalgia, left ear: Secondary | ICD-10-CM | POA: Diagnosis present

## 2017-02-10 DIAGNOSIS — Z79899 Other long term (current) drug therapy: Secondary | ICD-10-CM | POA: Diagnosis not present

## 2017-02-10 MED ORDER — AMOXICILLIN 250 MG PO CAPS: 500.0000 mg | ORAL_CAPSULE | Freq: Two times a day (BID) | ORAL | 0 refills | Status: AC

## 2017-02-10 NOTE — ED Triage Notes (Signed)
Pain to left ear x 1 week, denies any drainage or ringing to affected ear.

## 2017-02-10 NOTE — ED Provider Notes (Signed)
[facility]-EMERGENCY DEPT Provider Note   CSN: [id number] Arrival date & time: 02/10/17  1905     History   Chief Complaint Chief Complaint  Patient presents with  . Otalgia    HPI Darryl Elliott is a 46 y.o. male who presents today with chief complaint acute onset, constant left ear pain for 1 week.  Patient states pain is throbbing and severe in nature, no aggravating or alleviating factors noted.  He endorses mild muffled hearing to the left ear.  Denies tinnitus.  Endorses subjective fevers and chills at home.  Also endorses mild left-sided headache but denies vision changes, numbness, tingling, weakness, nausea, vomiting, or photophobia.  He states pain radiates down to the left side of the neck.  He has not tried anything for his symptoms.  Denies recent swimming.  Denies drainage from the ear.  Denies shortness of breath, chest pain, nasal congestion, or sore throat.  Of note, patient states he has not been taking his blood pressure medications for several months.  He does have a primary care physician with whom he can follow-up with.  He also states "being here elevates my blood pressure".  The history is provided by the patient.    Past Medical History:  Diagnosis Date  . Pancreatitis     There are no active problems to display for this patient.   No past surgical history on file.     Home Medications    Prior to Admission medications   Medication Sig Start Date End Date Taking? Authorizing Provider  amoxicillin (AMOXIL) 250 MG capsule Take 2 capsules (500 mg total) 2 (two) times daily for 7 days by mouth. 02/10/17 02/17/17  Michela PitcherFawze, Taimi Towe A, PA-C  HYDROmorphone (DILAUDID) 4 MG tablet Take 0.5-1 tablets (2-4 mg total) by mouth every 4 (four) hours as needed (for pain). 03/11/13   Molpus, John, MD  ibuprofen (ADVIL,MOTRIN) 800 MG tablet Take 1 tablet (800 mg total) by mouth 3 (three) times daily. 07/15/15   Barrett, Rolm GalaStevi, PA-C  methocarbamol  (ROBAXIN) 500 MG tablet Can take up to 1-2 tabs every 6 hours PRN PAIN 02/22/16   Pisciotta, Joni ReiningNicole, PA-C  ondansetron (ZOFRAN ODT) 8 MG disintegrating tablet Take 1 tablet (8 mg total) by mouth every 8 (eight) hours as needed for nausea or vomiting. 8mg  ODT q4 hours prn nausea 03/11/13   Molpus, John, MD    Family History No family history on file.  Social History Social History   Tobacco Use  . Smoking status: Never Smoker  . Smokeless tobacco: Never Used  Substance Use Topics  . Alcohol use: Yes  . Drug use: Not on file     Allergies   Patient has no known allergies.   Review of Systems Review of Systems  Constitutional: Positive for chills and fever.  HENT: Positive for ear pain. Negative for congestion, ear discharge, facial swelling, sinus pressure, sinus pain, sore throat, tinnitus and trouble swallowing.   Eyes: Negative for visual disturbance.  Respiratory: Negative for shortness of breath.   Cardiovascular: Negative for chest pain.  Gastrointestinal: Negative for abdominal pain, nausea and vomiting.  Musculoskeletal: Negative for neck pain.  Neurological: Positive for headaches. Negative for syncope, weakness, light-headedness and numbness.  All other systems reviewed and are negative.    Physical Exam Updated Vital Signs BP (!) 205/90 (BP Location: Right Arm) Comment: hx of high BP; not taking meds  Pulse 76   Temp 98.5 F (36.9 C) (Oral)   Resp  18   SpO2 100%   Physical Exam  Constitutional: He appears well-developed and well-nourished. No distress.  HENT:  Head: Normocephalic and atraumatic.  Right Ear: External ear normal.  Left Ear: External ear normal.  No swelling or pain with movement of the pinna.  No swelling of the ear canals bilaterally.  Right TM without erythema or bulging.  Left TM partially obscured with cerumen although the portion that is visible shows erythema but no bulging.  No frontal or maxillary sinus TTP.  Nasal septum is midline  with pink mucosa, no mucosal edema.  Posterior oropharynx without tonsillar hypertrophy, exudates, uvular deviation, or erythema.  No trismus or sublingual abnormalities.  Eyes: Conjunctivae and EOM are normal. Pupils are equal, round, and reactive to light. Right eye exhibits no discharge. Left eye exhibits no discharge.  No papilledema  Neck: Normal range of motion. Neck supple. No JVD present. No tracheal deviation present.  Left anterior cervical lymphadenopathy  Cardiovascular: Normal rate, regular rhythm, normal heart sounds and intact distal pulses.  Pulmonary/Chest: Effort normal and breath sounds normal. No stridor. No respiratory distress. He has no wheezes. He has no rales. He exhibits no tenderness.  Abdominal: Soft. Bowel sounds are normal. He exhibits no distension. There is no tenderness.  Musculoskeletal: Normal range of motion. He exhibits no edema or tenderness.  Lymphadenopathy:    He has cervical adenopathy.  Neurological: He is alert.  Fluent speech, no facial droop  Skin: Skin is warm and dry. No erythema.  Psychiatric: He has a normal mood and affect. His behavior is normal.  Nursing note and vitals reviewed.    ED Treatments / Results  Labs (all labs ordered are listed, but only abnormal results are displayed) Labs Reviewed - No data to display  EKG  EKG Interpretation None       Radiology No results found.  Procedures Procedures (including critical care time)  Medications Ordered in ED Medications - No data to display   Initial Impression / Assessment and Plan / ED Course  I have reviewed the triage vital signs and the nursing notes.  Pertinent labs & imaging results that were available during my care of the patient were reviewed by me and considered in my medical decision making (see chart for details).     Patient presents with otalgia and exam consistent with acute otitis media. No concern for acute mastoiditis, meningitis.  No antibiotic  use in the last month.  Patient discharged home with Amoxicillin.  He also presents with asymptomatic hypertension.  He is noncompliant with his blood pressure medications.  I emphasized the importance of resuming his medications and recommend follow-up with primary care physician in the next 3-4 days for reevaluation of his ear pain and for medication management of his hypertension.  Discussed indications for return to the ED.  Pt verbalized understanding of and agreement with plan and is safe for discharge home at this time.  Final Clinical Impressions(s) / ED Diagnoses   Final diagnoses:  Other acute nonsuppurative otitis media of left ear, recurrence not specified  Hypertension, unspecified type    ED Discharge Orders        Ordered    amoxicillin (AMOXIL) 250 MG capsule  2 times daily     02/10/17 2016       Jeanie SewerFawze, Elvina Bosch A, PA-C 02/10/17 2018    Abelino DerrickMackuen, Courteney Lyn, MD 02/10/17 [phone]

## 2017-02-10 NOTE — Discharge Instructions (Signed)
Please take all of your antibiotics until finished!   You may develop abdominal discomfort or diarrhea from the antibiotic.  You may help offset this with probiotics which you can buy or get in yogurt. Do not eat  or take the probiotics until 2 hours after your antibiotic.   Alternate 600 mg of ibuprofen and [phone] mg of Tylenol every 3 hours as needed for pain. Do not exceed 4000 mg of Tylenol daily.   Follow-up with your primary care physician for reevaluation of your ear pain and your high blood pressure.  Return to the ED immediately for any concerning signs or symptoms develop such as fever not controlled by ibuprofen or Tylenol, difficulty swallowing or breathing, or worsening pain.

## 2017-05-20 ENCOUNTER — Encounter (HOSPITAL_COMMUNITY): Payer: Self-pay

## 2017-05-20 ENCOUNTER — Emergency Department (HOSPITAL_COMMUNITY)
Admission: EM | Admit: 2017-05-20 | Discharge: 2017-05-20 | Disposition: A | Discharge status: Home / Self Care | Payer: Commercial Managed Care - PPO | Attending: Emergency Medicine | Admitting: Emergency Medicine

## 2017-05-20 DIAGNOSIS — Y999 Unspecified external cause status: Secondary | ICD-10-CM | POA: Insufficient documentation

## 2017-05-20 DIAGNOSIS — Y929 Unspecified place or not applicable: Secondary | ICD-10-CM | POA: Insufficient documentation

## 2017-05-20 DIAGNOSIS — X503XXA Overexertion from repetitive movements, initial encounter: Secondary | ICD-10-CM | POA: Insufficient documentation

## 2017-05-20 DIAGNOSIS — R29810 Facial weakness: Secondary | ICD-10-CM | POA: Diagnosis present

## 2017-05-20 DIAGNOSIS — Y939 Activity, unspecified: Secondary | ICD-10-CM | POA: Diagnosis not present

## 2017-05-20 DIAGNOSIS — G510 Bell's palsy: Secondary | ICD-10-CM | POA: Insufficient documentation

## 2017-05-20 DIAGNOSIS — S39012A Strain of muscle, fascia and tendon of lower back, initial encounter: Secondary | ICD-10-CM | POA: Diagnosis not present

## 2017-05-20 MED ORDER — PREDNISONE 10 MG (21) PO TBPK: ORAL_TABLET | ORAL | 0 refills | Status: DC

## 2017-05-20 MED ORDER — PREDNISONE 20 MG PO TABS
60.0000 mg | ORAL_TABLET | Freq: Once | ORAL | Status: AC
  Administered 2017-05-20: 60 mg via ORAL
  Filled 2017-05-20: qty 3

## 2017-05-20 MED ORDER — VALACYCLOVIR HCL 500 MG PO TABS
1000.0000 mg | ORAL_TABLET | Freq: Once | ORAL | Status: AC
  Administered 2017-05-20: 1000 mg via ORAL
  Filled 2017-05-20: qty 2

## 2017-05-20 MED ORDER — VALACYCLOVIR HCL 1 G PO TABS: 1000.0000 mg | ORAL_TABLET | Freq: Three times a day (TID) | ORAL | 0 refills | Status: DC

## 2017-05-20 NOTE — ED Provider Notes (Signed)
[facility]-EMERGENCY DEPT Provider Note   CSN: [id number] Arrival date & time: 05/20/17  0602     History   Chief Complaint Chief Complaint  Patient presents with  . facial paralysis    HPI Darryl Elliott is a 47 y.o. male.  Pt presents to the ED today with facial weakness since Sunday, Feb. 10th.  Pt noticed that the left side of his face was not working when he woke up.  He thought it would get better, but it has not.  He also has low back pain that has been going on for a long time.  He has a job that requires a lot of heavy lifting.  No numbness/tingling in his legs.      Past Medical History:  Diagnosis Date  . Pancreatitis     There are no active problems to display for this patient.   History reviewed. No pertinent surgical history.     Home Medications    Prior to Admission medications   Medication Sig Start Date End Date Taking? Authorizing Provider  HYDROmorphone (DILAUDID) 4 MG tablet Take 0.5-1 tablets (2-4 mg total) by mouth every 4 (four) hours as needed (for pain). 03/11/13   Molpus, John, MD  ibuprofen (ADVIL,MOTRIN) 800 MG tablet Take 1 tablet (800 mg total) by mouth 3 (three) times daily. 07/15/15   Barrett, Rolm GalaStevi, PA-C  methocarbamol (ROBAXIN) 500 MG tablet Can take up to 1-2 tabs every 6 hours PRN PAIN 02/22/16   Pisciotta, Joni ReiningNicole, PA-C  ondansetron (ZOFRAN ODT) 8 MG disintegrating tablet Take 1 tablet (8 mg total) by mouth every 8 (eight) hours as needed for nausea or vomiting. 8mg  ODT q4 hours prn nausea 03/11/13   Molpus, John, MD  predniSONE (STERAPRED UNI-PAK 21 TAB) 10 MG (21) TBPK tablet Take 6 tabs by mouth daily  for 2 days, then 5 tabs for 2 days, then 4 tabs for 2 days, then 3 tabs for 2 days, 2 tabs for 2 days, then 1 tab by mouth daily for 2 days 05/20/17   Jacalyn LefevreHaviland, Aleah Ahlgrim, MD  valACYclovir (VALTREX) 1000 MG tablet Take 1 tablet (1,000 mg total) by mouth 3 (three) times daily. 05/20/17   Jacalyn LefevreHaviland, Haniya Fern, MD    Family  History History reviewed. No pertinent family history.  Social History Social History   Tobacco Use  . Smoking status: Never Smoker  . Smokeless tobacco: Never Used  Substance Use Topics  . Alcohol use: Yes  . Drug use: No     Allergies   Patient has no known allergies.   Review of Systems Review of Systems  Musculoskeletal: Positive for back pain.  Neurological:       Left face weakness  All other systems reviewed and are negative.    Physical Exam Updated Vital Signs BP (!) 177/84 (BP Location: Right Arm)   Pulse 76   Temp 98 F (36.7 C) (Oral)   Resp 18   SpO2 99%   Physical Exam  Constitutional: He is oriented to person, place, and time. He appears well-developed and well-nourished.  HENT:  Right Ear: External ear normal.  Left Ear: External ear normal.  Nose: Nose normal.  Mouth/Throat: Oropharynx is clear and moist.  Eyes: Conjunctivae and EOM are normal. Pupils are equal, round, and reactive to light.  Neck: Normal range of motion. Neck supple.  Cardiovascular: Normal rate, regular rhythm, normal heart sounds and intact distal pulses.  Pulmonary/Chest: Effort normal and breath sounds normal.  Abdominal: Soft. Bowel  sounds are normal.  Musculoskeletal: Normal range of motion.       Lumbar back: He exhibits spasm.  Bilateral lumbar muscle spasm.  Neurological: He is alert and oriented to person, place, and time.  Facial nerve 7 on the left is paralyzed.  No forehead muscle movement on the left.  Skin: Skin is warm. Capillary refill takes less than 2 seconds.  Psychiatric: He has a normal mood and affect. His behavior is normal. Judgment and thought content normal.  Nursing note and vitals reviewed.    ED Treatments / Results  Labs (all labs ordered are listed, but only abnormal results are displayed) Labs Reviewed - No data to display  EKG  EKG Interpretation None       Radiology No results found.  Procedures Procedures (including  critical care time)  Medications Ordered in ED Medications  predniSONE (DELTASONE) tablet 60 mg (not administered)  valACYclovir (VALTREX) tablet 1,000 mg (not administered)     Initial Impression / Assessment and Plan / ED Course  I have reviewed the triage vital signs and the nursing notes.  Pertinent labs & imaging results that were available during my care of the patient were reviewed by me and considered in my medical decision making (see chart for details).    Pt encouraged to stretch and exercise for his back.  He will be started on prednisone and valtrex for bells palsy.  He is told to tape his left eye shut and use refresh drops to keep eye moist.  Return if worse.    Final Clinical Impressions(s) / ED Diagnoses   Final diagnoses:  Bell's palsy  Lumbar strain, initial encounter    ED Discharge Orders        Ordered    predniSONE (STERAPRED UNI-PAK 21 TAB) 10 MG (21) TBPK tablet     05/20/17 0725    valACYclovir (VALTREX) 1000 MG tablet  3 times daily     05/20/17 0725       Jacalyn Lefevre, MD 05/20/17 [phone]
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
## 2017-05-20 NOTE — ED Triage Notes (Signed)
Pt complains of high blood pressure and left sided facial paralysis since Sunday, no other neuro deficits noted

## 2018-11-02 ENCOUNTER — Other Ambulatory Visit: Payer: Self-pay

## 2018-11-02 DIAGNOSIS — Z20822 Contact with and (suspected) exposure to covid-19: Secondary | ICD-10-CM

## 2018-11-04 LAB — NOVEL CORONAVIRUS, NAA: SARS-CoV-2, NAA: NOT DETECTED

## 2019-08-23 ENCOUNTER — Emergency Department (HOSPITAL_COMMUNITY): Payer: Commercial Managed Care - PPO

## 2019-08-23 ENCOUNTER — Emergency Department (HOSPITAL_COMMUNITY)
Admission: EM | Admit: 2019-08-23 | Discharge: 2019-08-23 | Disposition: A | Discharge status: Home / Self Care | Payer: Commercial Managed Care - PPO | Attending: Emergency Medicine | Admitting: Emergency Medicine

## 2019-08-23 ENCOUNTER — Encounter (HOSPITAL_COMMUNITY): Payer: Self-pay

## 2019-08-23 ENCOUNTER — Other Ambulatory Visit: Payer: Self-pay

## 2019-08-23 ENCOUNTER — Ambulatory Visit (INDEPENDENT_AMBULATORY_CARE_PROVIDER_SITE_OTHER)
Admission: EM | Admit: 2019-08-23 | Discharge: 2019-08-23 | Disposition: A | Discharge status: Home / Self Care | Payer: Commercial Managed Care - PPO | Source: Home / Self Care

## 2019-08-23 DIAGNOSIS — R10829 Rebound abdominal tenderness, unspecified site: Secondary | ICD-10-CM

## 2019-08-23 DIAGNOSIS — R1013 Epigastric pain: Secondary | ICD-10-CM | POA: Diagnosis present

## 2019-08-23 DIAGNOSIS — Z8719 Personal history of other diseases of the digestive system: Secondary | ICD-10-CM

## 2019-08-23 DIAGNOSIS — R110 Nausea: Secondary | ICD-10-CM | POA: Insufficient documentation

## 2019-08-23 DIAGNOSIS — R1084 Generalized abdominal pain: Secondary | ICD-10-CM | POA: Diagnosis not present

## 2019-08-23 DIAGNOSIS — K852 Alcohol induced acute pancreatitis without necrosis or infection: Secondary | ICD-10-CM | POA: Diagnosis not present

## 2019-08-23 LAB — URINALYSIS, ROUTINE W REFLEX MICROSCOPIC
Bacteria, UA: NONE SEEN
Bilirubin Urine: NEGATIVE
Glucose, UA: NEGATIVE mg/dL
Hgb urine dipstick: NEGATIVE
Ketones, ur: 20 mg/dL — AB
Leukocytes,Ua: NEGATIVE
Nitrite: NEGATIVE
Protein, ur: 100 mg/dL — AB
Specific Gravity, Urine: 1.024 (ref 1.005–1.030)
pH: 5 (ref 5.0–8.0)

## 2019-08-23 LAB — CBC
HCT: 46.7 % (ref 39.0–52.0)
Hemoglobin: 15 g/dL (ref 13.0–17.0)
MCH: 28.1 pg (ref 26.0–34.0)
MCHC: 32.1 g/dL (ref 30.0–36.0)
MCV: 87.5 fL (ref 80.0–100.0)
Platelets: 186 10*3/uL (ref 150–400)
RBC: 5.34 MIL/uL (ref 4.22–5.81)
RDW: 13.1 % (ref 11.5–15.5)
WBC: 10.5 10*3/uL (ref 4.0–10.5)
nRBC: 0 % (ref 0.0–0.2)

## 2019-08-23 LAB — COMPREHENSIVE METABOLIC PANEL
ALT: 17 U/L (ref 0–44)
AST: 16 U/L (ref 15–41)
Albumin: 3.9 g/dL (ref 3.5–5.0)
Alkaline Phosphatase: 73 U/L (ref 38–126)
Anion gap: 9 (ref 5–15)
BUN: 13 mg/dL (ref 6–20)
CO2: 31 mmol/L (ref 22–32)
Calcium: 8.5 mg/dL — ABNORMAL LOW (ref 8.9–10.3)
Chloride: 94 mmol/L — ABNORMAL LOW (ref 98–111)
Creatinine, Ser: 0.96 mg/dL (ref 0.61–1.24)
GFR calc Af Amer: >60 mL/min (ref >60)
GFR calc non Af Amer: >60 mL/min (ref >60)
Glucose, Bld: 102 mg/dL — ABNORMAL HIGH (ref 70–99)
Potassium: 3.6 mmol/L (ref 3.5–5.1)
Sodium: 134 mmol/L — ABNORMAL LOW (ref 135–145)
Total Bilirubin: 1.5 mg/dL — ABNORMAL HIGH (ref 0.3–1.2)
Total Protein: 7.7 g/dL (ref 6.5–8.1)

## 2019-08-23 LAB — LIPASE, BLOOD
Lipase: 109 U/L — ABNORMAL HIGH (ref 11–51)
Lipase: 110 U/L — ABNORMAL HIGH (ref 11–51)

## 2019-08-23 MED ORDER — OXYCODONE HCL 5 MG PO TABS: 5.0000 mg | ORAL_TABLET | Freq: Four times a day (QID) | ORAL | 0 refills | Status: DC | PRN

## 2019-08-23 MED ORDER — IOHEXOL 300 MG/ML  SOLN
100.0000 mL | Freq: Once | INTRAMUSCULAR | Status: AC | PRN
  Administered 2019-08-23: 100 mL via INTRAVENOUS

## 2019-08-23 MED ORDER — ONDANSETRON HCL 4 MG/2ML IJ SOLN
4.0000 mg | Freq: Once | INTRAMUSCULAR | Status: AC
  Administered 2019-08-23: 4 mg via INTRAVENOUS
  Filled 2019-08-23: qty 2

## 2019-08-23 MED ORDER — MORPHINE SULFATE (PF) 4 MG/ML IV SOLN
4.0000 mg | Freq: Once | INTRAVENOUS | Status: AC
  Administered 2019-08-23: 4 mg via INTRAVENOUS
  Filled 2019-08-23: qty 1

## 2019-08-23 MED ORDER — ONDANSETRON HCL 4 MG PO TABS: 4.0000 mg | ORAL_TABLET | Freq: Four times a day (QID) | ORAL | 0 refills | Status: AC

## 2019-08-23 MED ORDER — SODIUM CHLORIDE 0.9% FLUSH: 3.0000 mL | Freq: Once | INTRAVENOUS | Status: DC

## 2019-08-23 MED ORDER — SODIUM CHLORIDE (PF) 0.9 % IJ SOLN
INTRAMUSCULAR | Status: AC
  Filled 2019-08-23: qty 50

## 2019-08-23 MED ORDER — SODIUM CHLORIDE 0.9 % IV BOLUS
1000.0000 mL | Freq: Once | INTRAVENOUS | Status: AC
  Administered 2019-08-23: 1000 mL via INTRAVENOUS

## 2019-08-23 NOTE — Discharge Instructions (Addendum)
Follow-up with gastroenterology to discuss need for colonoscopy to make sure you do not have mass in your colon.  This could be concerning for cancer.  Also discussed with your primary care doctor about obtaining an MRI of your left kidney to make sure there is no mass in that area as well.  Please return to the ED if symptoms worsen.  Please keep a bland diet as discussed.  Use Zofran and narcotic pain medicine as discussed.

## 2019-08-23 NOTE — ED Provider Notes (Signed)
[facility]-EMERGENCY DEPT Provider Note   CSN: [id number] Arrival date & time: 08/23/19  7209     History Chief Complaint  Patient presents with  . Abdominal Pain    Darryl Elliott is a 49 y.o. male.  The history is provided by the patient.  Abdominal Pain Pain location:  Epigastric Pain quality: aching   Pain radiates to:  Back Pain severity:  Mild Onset quality:  Gradual Duration:  3 days Timing:  Constant Progression:  Improving Chronicity:  New Context: alcohol use   Relieved by:  Nothing Worsened by:  Nothing Associated symptoms: anorexia and nausea   Associated symptoms: no chest pain, no chills, no constipation, no cough, no diarrhea, no dysuria, no fever, no hematuria, no shortness of breath, no sore throat and no vomiting   Risk factors: has not had multiple surgeries        Past Medical History:  Diagnosis Date  . Pancreatitis     There are no problems to display for this patient.   History reviewed. No pertinent surgical history.     Family History  Family history unknown: Yes    Social History   Tobacco Use  . Smoking status: Never Smoker  . Smokeless tobacco: Never Used  Substance Use Topics  . Alcohol use: Yes  . Drug use: No    Home Medications Prior to Admission medications   Medication Sig Start Date End Date Taking? Authorizing Provider  HYDROmorphone (DILAUDID) 4 MG tablet Take 0.5-1 tablets (2-4 mg total) by mouth every 4 (four) hours as needed (for pain). Patient not taking: Reported on 08/23/2019 03/11/13   Molpus, Jonny Ruiz, MD  ibuprofen (ADVIL,MOTRIN) 800 MG tablet Take 1 tablet (800 mg total) by mouth 3 (three) times daily. 07/15/15   Barrett, Rolm Gala, PA-C  methocarbamol (ROBAXIN) 500 MG tablet Can take up to 1-2 tabs every 6 hours PRN PAIN Patient not taking: Reported on 08/23/2019 02/22/16   Pisciotta, Joni Reining, PA-C  ondansetron (ZOFRAN ODT) 8 MG disintegrating tablet Take 1 tablet (8 mg total) by mouth  every 8 (eight) hours as needed for nausea or vomiting. 8mg  ODT q4 hours prn nausea Patient not taking: Reported on 08/23/2019 03/11/13   Molpus, 14/5/14, MD  ondansetron (ZOFRAN) 4 MG tablet Take 1 tablet (4 mg total) by mouth every 6 (six) hours for 20 doses. 08/23/19 08/28/19  Keano Guggenheim, DO  oxyCODONE (ROXICODONE) 5 MG immediate release tablet Take 1 tablet (5 mg total) by mouth every 6 (six) hours as needed for up to 10 doses for severe pain or breakthrough pain. 08/23/19   Maisie Hauser, DO  predniSONE (STERAPRED UNI-PAK 21 TAB) 10 MG (21) TBPK tablet Take 6 tabs by mouth daily  for 2 days, then 5 tabs for 2 days, then 4 tabs for 2 days, then 3 tabs for 2 days, 2 tabs for 2 days, then 1 tab by mouth daily for 2 days Patient not taking: Reported on 08/23/2019 05/20/17   05/22/17, MD  valACYclovir (VALTREX) 1000 MG tablet Take 1 tablet (1,000 mg total) by mouth 3 (three) times daily. 05/20/17   05/22/17, MD    Allergies    Patient has no known allergies.  Review of Systems   Review of Systems  Constitutional: Negative for chills and fever.  HENT: Negative for ear pain and sore throat.   Eyes: Negative for pain and visual disturbance.  Respiratory: Negative for cough and shortness of breath.   Cardiovascular: Negative for chest pain  and palpitations.  Gastrointestinal: Positive for abdominal pain, anorexia and nausea. Negative for constipation, diarrhea and vomiting.  Genitourinary: Negative for dysuria and hematuria.  Musculoskeletal: Negative for arthralgias and back pain.  Skin: Negative for color change and rash.  Neurological: Negative for seizures and syncope.  All other systems reviewed and are negative.   Physical Exam Updated Vital Signs  ED Triage Vitals  Enc Vitals Group     BP 08/23/19 0946 (!) 150/95     Pulse Rate 08/23/19 0946 90     Resp 08/23/19 0946 18     Temp 08/23/19 0946 (!) 97.4 F (36.3 C)     Temp Source 08/23/19 0946 Oral     SpO2 08/23/19  0946 98 %     Weight 08/23/19 0950 256 lb (116.1 kg)     Height 08/23/19 0950 6\' 3"  (1.905 m)     Head Circumference --      Peak Flow --      Pain Score 08/23/19 0948 9     Pain Loc --      Pain Edu? --      Excl. in GC? --     Physical Exam Vitals and nursing note reviewed.  Constitutional:      Appearance: He is well-developed.  HENT:     Head: Normocephalic and atraumatic.  Eyes:     Extraocular Movements: Extraocular movements intact.     Conjunctiva/sclera: Conjunctivae normal.  Cardiovascular:     Rate and Rhythm: Normal rate and regular rhythm.     Heart sounds: Normal heart sounds. No murmur.  Pulmonary:     Effort: Pulmonary effort is normal. No respiratory distress.     Breath sounds: Normal breath sounds.  Abdominal:     General: There is distension.     Palpations: Abdomen is soft.     Tenderness: There is abdominal tenderness in the epigastric area. There is no right CVA tenderness, left CVA tenderness, guarding or rebound. Negative signs include Murphy's sign, Rovsing's sign, McBurney's sign and psoas sign.  Musculoskeletal:     Cervical back: Neck supple.  Skin:    General: Skin is warm and dry.     Capillary Refill: Capillary refill takes less than 2 seconds.  Neurological:     General: No focal deficit present.     Mental Status: He is alert.  Psychiatric:        Mood and Affect: Mood normal.     ED Results / Procedures / Treatments   Labs (all labs ordered are listed, but only abnormal results are displayed) Labs Reviewed  LIPASE, BLOOD - Abnormal; Notable for the following components:      Result Value   Lipase 110 (*)    All other components within normal limits  LIPASE, BLOOD - Abnormal; Notable for the following components:   Lipase 109 (*)    All other components within normal limits  COMPREHENSIVE METABOLIC PANEL - Abnormal; Notable for the following components:   Sodium 134 (*)    Chloride 94 (*)    Glucose, Bld 102 (*)    Calcium  8.5 (*)    Total Bilirubin 1.5 (*)    All other components within normal limits  URINALYSIS, ROUTINE W REFLEX MICROSCOPIC - Abnormal; Notable for the following components:   Color, Urine AMBER (*)    Ketones, ur 20 (*)    Protein, ur 100 (*)    All other components within normal limits  CBC    EKG  None  Radiology CT ABDOMEN PELVIS W CONTRAST  Result Date: 08/23/2019 CLINICAL DATA:  Patient reports abdominal swelling for 3 days. Pancreatitis suspected. History of pancreatitis. EXAM: CT ABDOMEN AND PELVIS WITH CONTRAST TECHNIQUE: Multidetector CT imaging of the abdomen and pelvis was performed using the standard protocol following bolus administration of intravenous contrast. CONTRAST:  176mL OMNIPAQUE IOHEXOL 300 MG/ML  SOLN COMPARISON:  CT 03/08/2013 FINDINGS: Lower chest: Streaky opacities at the lung bases may represent atelectasis or underlying scar. Trace pleural effusions, right greater than left. Hepatobiliary: Mild hepatic steatosis without evidence of focal lesion. Punctate hepatic granuloma. Circle bladder Pancreas: Mild diffuse peripancreatic fat stranding and edema. Small amount of non organized free fluid in the lesser sac and adjacent to the pancreatic body. No acute peripancreatic collection. No ductal dilatation. No evidence of pancreatic necrosis. Spleen: Normal in size. Splenic vein is patent. No focal abnormality. Adrenals/Urinary Tract: No adrenal nodule. No hydronephrosis. Minor bilateral perinephric edema that is symmetric. Symmetric excretion on delayed phase imaging. Cysts in the right kidney largest in the lower pole measures 3.3 cm. There is an indeterminate low-density lesion in the posterior lower left kidney measuring 17 mm, series 2, image 40. urinary bladder physiologically distended and unremarkable. Stomach/Bowel: Mild wall thickening about the distal stomach adjacent pancreatic inflammation is likely reactive. There a few prominent fluid-filled loops of small bowel  in the lower abdomen without obstruction or small bowel inflammation. Normal appendix. Scattered colonic diverticula without diverticulitis. There is a 3 cm region of narrowing of the mid sigmoid colon, appreciated on coronal reformat series 5, image 115. Vascular/Lymphatic: Aorto bi-iliac atherosclerosis. No aortic aneurysm. The portal and splenic veins are patent. No evidence of mesenteric venous thrombus. Small peripancreatic lymph nodes and nodes at the root of the mesentery, all subcentimeter and likely reactive. No pelvic adenopathy. Reproductive: Prostate is unremarkable. Other: Small volume of ascites tracks into the pericolic gutters, right greater than left upper quadrants, and into the pelvis. Generalized upper abdominal edema related to pancreatitis. No free air. Small fat containing umbilical hernia. Mild subcutaneous edema in the periumbilical region of unknown significance. Musculoskeletal: There are no acute or suspicious osseous abnormalities. Some degenerative partial fusion of the right sacroiliac joint. Benign-appearing lucent lesion with peripheral sclerosis in the left femoral neck. IMPRESSION: 1. Acute pancreatitis. No acute peripancreatic collection or pancreatic necrosis. 2. Mild hepatic steatosis. 3. Colonic diverticulosis without diverticulitis. There is a 3 cm region of narrowing of the mid sigmoid colon. While this may be due to peristalsis, recommend correlation with colonoscopy to exclude underlying neoplasm. 4. Indeterminate 17 mm low-density lesion in the posterior lower left kidney. Recommend further characterization with renal protocol MRI on an elective basis when patient is able to tolerate breath hold technique. 5. Trace pleural effusions, right greater than left. Streaky opacities at the lung bases may represent atelectasis or scarring. Aortic Atherosclerosis (ICD10-I70.0). Electronically Signed   By: Keith Rake M.D.   On: 08/23/2019 14:27    Procedures Procedures  (including critical care time)  Medications Ordered in ED Medications  sodium chloride flush (NS) 0.9 % injection 3 mL ( Intravenous Canceled Entry 08/23/19 1329)  sodium chloride (PF) 0.9 % injection (  Canceled Entry 08/23/19 1423)  sodium chloride 0.9 % bolus 1,000 mL (1,000 mLs Intravenous New Bag/Given 08/23/19 1329)  ondansetron (ZOFRAN) injection 4 mg (4 mg Intravenous Given 08/23/19 1328)  morphine 4 MG/ML injection 4 mg (4 mg Intravenous Given 08/23/19 1328)  iohexol (OMNIPAQUE) 300 MG/ML solution 100 mL (100  mLs Intravenous Contrast Given 08/23/19 1355)    ED Course  I have reviewed the triage vital signs and the nursing notes.  Pertinent labs & imaging results that were available during my care of the patient were reviewed by me and considered in my medical decision making (see chart for details).    MDM Rules/Calculators/A&P                      BRAYDIN ALOI is a 49 year old male with history of pancreatitis who presents to the ED with abdominal pain.  Pain started Sunday night after drinking alcohol Saturday.  Pain has improved over the last 3 days.  Was sent from urgent care as concern for pancreatitis.  Patient tender in the epigastric region.  Lipase mildly elevated to 110.  CT scan does show changes of acute pancreatitis but given lipase only of 110 likely this is improving.  Patient did have significant elevation of lipase in the past.  Patient will otherwise no significant anemia, electrolyte abnormality, kidney injury.  No leukocytosis.  No urinary tract infection.  Offered patient admission for further pain control and hydration but he prefers to continue treatment at home.  Patient felt better after IV fluids, IV Zofran, IV morphine.  I think that this is a reasonable plan.  Recommend liquid diet until pain is fully improved and then slowly advancing his diet.  Will prescribe Zofran and narcotic pain medicine for breakthrough pain.  Educated about alcohol use and need to  likely stop as this is the cause of his pancreatitis.  Gallbladder and liver enzymes were normal.  Incidentally patient was found to have possibly a lesion on the left kidney and knows to follow-up with primary care doctor for possible MRI of his kidney.  Possible neoplasm in the sigmoid colon and given information to follow-up with GI for colonoscopy.  Patient prefers discharge and that was granted.  He understands return precautions.  Patient discharged in good condition.  This chart was dictated using voice recognition software.  Despite best efforts to proofread,  errors can occur which can change the documentation meaning.    Final Clinical Impression(s) / ED Diagnoses Final diagnoses:  Alcohol-induced acute pancreatitis, unspecified complication status    Rx / DC Orders ED Discharge Orders         Ordered    oxyCODONE (ROXICODONE) 5 MG immediate release tablet  Every 6 hours PRN     08/23/19 1452    ondansetron (ZOFRAN) 4 MG tablet  Every 6 hours     05 /18/21 1453           05-09-1993, DO 08/23/19 1457

## 2019-08-23 NOTE — ED Provider Notes (Signed)
[facility]    CSN: [id number] Arrival date & time: 08/23/19  0802      History   Chief Complaint Chief Complaint  Patient presents with  . Abdominal Pain    HPI Darryl Elliott is a 49 y.o. male.   Patient with history of pancreatitis, hypertension and hyperlipidemia presents for 3-day history of abdominal pain and abdominal swelling.  He reports he feels like this is consistent with he previously had pancreatitis.  He reports this was several years ago and per chart review it appears that he was seen in 2014 in emergency departments for pancreatitis.  He reports abdominal pain started centrally and has since become more generalized.  He reports he does feel like the pain radiates around the sides bilaterally.  He reports it gets worse with certain movements or laying down.  He reports certain positions make it feel little better.  He denies any nausea or vomiting but has had decreased appetite.  He has been drinking 5-6 bottles of water and not consuming foods as he is trying to " dehydrate himself".  On further questioning it does appear that he is attempting to hydrate himself just minimize food intake.  He reports dry mouth that clears with drinking some water.  He believes that the belly swelling has gotten a little better however his pain has been persistent.  He ranks the pain up to an 8-10/10 at its worst.  He reports he is not had a bowel movement since his belly pain started.  He attributes this to not eating any foods.  Prior to this he was a regular once a day bowel movement.   He denies any painful urination, frequency of urination or blood in his urine.  He reports he is continue to make urine but it is a little darker yellow.  Denies any testicular pain.  Denies fever or chills.  Denies chest pain or shortness of breath.  Denies headache, cough, sore throat or other upper respiratory symptoms.   Patient is not a smoker however he is an active drinker.  He believes  he may have been drinking a little more in the week leading up to his belly pain.  He reports he is compliant with his hypertension medicines.     Past Medical History:  Diagnosis Date  . Pancreatitis     There are no problems to display for this patient.   History reviewed. No pertinent surgical history.     Home Medications    Prior to Admission medications   Medication Sig Start Date End Date Taking? Authorizing Provider  HYDROmorphone (DILAUDID) 4 MG tablet Take 0.5-1 tablets (2-4 mg total) by mouth every 4 (four) hours as needed (for pain). Patient not taking: Reported on 08/23/2019 03/11/13   Molpus, Jenny Reichmann, MD  ibuprofen (ADVIL,MOTRIN) 800 MG tablet Take 1 tablet (800 mg total) by mouth 3 (three) times daily. 07/15/15   Barrett, Lahoma Crocker, PA-C  methocarbamol (ROBAXIN) 500 MG tablet Can take up to 1-2 tabs every 6 hours PRN PAIN Patient not taking: Reported on 08/23/2019 02/22/16   Pisciotta, Elmyra Ricks, PA-C  ondansetron (ZOFRAN ODT) 8 MG disintegrating tablet Take 1 tablet (8 mg total) by mouth every 8 (eight) hours as needed for nausea or vomiting. 8mg  ODT q4 hours prn nausea Patient not taking: Reported on 08/23/2019 03/11/13   Molpus, John, MD  predniSONE (STERAPRED UNI-PAK 21 TAB) 10 MG (21) TBPK tablet Take 6 tabs by mouth daily  for 2 days, then 5  tabs for 2 days, then 4 tabs for 2 days, then 3 tabs for 2 days, 2 tabs for 2 days, then 1 tab by mouth daily for 2 days Patient not taking: Reported on 08/23/2019 05/20/17   Jacalyn Lefevre, MD  valACYclovir (VALTREX) 1000 MG tablet Take 1 tablet (1,000 mg total) by mouth 3 (three) times daily. 05/20/17   Jacalyn Lefevre, MD    Family History History reviewed. No pertinent family history.  Social History Social History   Tobacco Use  . Smoking status: Never Smoker  . Smokeless tobacco: Never Used  Substance Use Topics  . Alcohol use: Yes  . Drug use: No     Allergies   Patient has no known allergies.   Review of  Systems Review of Systems  Constitutional: Negative for chills and fever.  HENT: Negative.  Negative for ear pain and sore throat.   Respiratory: Negative for cough and shortness of breath.   Cardiovascular: Negative for chest pain and palpitations.  Gastrointestinal: Positive for abdominal distention, abdominal pain and constipation. Negative for diarrhea, nausea and vomiting.  Genitourinary: Positive for flank pain. Negative for difficulty urinating, discharge, dysuria, frequency, hematuria, penile pain, testicular pain and urgency.  Musculoskeletal: Negative for arthralgias, back pain and myalgias.  Skin: Negative for color change and rash.  Neurological: Negative for dizziness and headaches.  All other systems reviewed and are negative.    Physical Exam Triage Vital Signs ED Triage Vitals  Enc Vitals Group     BP 08/23/19 0820 (!) 149/85     Pulse Rate 08/23/19 0820 100     Resp 08/23/19 0820 18     Temp 08/23/19 0820 99.2 F (37.3 C)     Temp Source 08/23/19 0820 Oral     SpO2 08/23/19 0820 98 %     Weight 08/23/19 0817 256 lb (116.1 kg)     Height --      Head Circumference --      Peak Flow --      Pain Score 08/23/19 0817 8     Pain Loc --      Pain Edu? --      Excl. in GC? --    No data found.  Updated Vital Signs BP (!) 149/85 (BP Location: Right Arm)   Pulse 100   Temp 99.2 F (37.3 C) (Oral)   Resp 18   Wt 256 lb (116.1 kg)   SpO2 98%   Visual Acuity Right Eye Distance:   Left Eye Distance:   Bilateral Distance:    Right Eye Near:   Left Eye Near:    Bilateral Near:     Physical Exam Vitals and nursing note reviewed.  Constitutional:      General: He is not in acute distress.    Appearance: He is well-developed. He is diaphoretic (mildly after exam).  HENT:     Head: Normocephalic and atraumatic.     Mouth/Throat:     Comments: Mucous membranes mildly dry. Eyes:     Conjunctiva/sclera: Conjunctivae normal.  Cardiovascular:     Rate and  Rhythm: Normal rate and regular rhythm.     Heart sounds: No murmur.     Comments: Borderline tachy at 100 Pulmonary:     Effort: Pulmonary effort is normal. No respiratory distress.     Breath sounds: Normal breath sounds. No wheezing or rales.  Abdominal:     General: Abdomen is protuberant. A surgical scar is present. Bowel sounds are decreased. There is  distension.     Palpations: Abdomen is soft. There is no pulsatile mass.     Tenderness: There is generalized abdominal tenderness. There is guarding and rebound. There is no right CVA tenderness or left CVA tenderness.     Comments: Unable to appreciate any organomegaly due to elicited pain on palpation.  Musculoskeletal:     Cervical back: Neck supple.  Skin:    General: Skin is warm.  Neurological:     General: No focal deficit present.     Mental Status: He is alert.      UC Treatments / Results  Labs (all labs ordered are listed, but only abnormal results are displayed) Labs Reviewed - No data to display  EKG   Radiology No results found.  Procedures Procedures (including critical care time)  Medications Ordered in UC Medications - No data to display  Initial Impression / Assessment and Plan / UC Course  I have reviewed the triage vital signs and the nursing notes.  Pertinent labs & imaging results that were available during my care of the patient were reviewed by me and considered in my medical decision making (see chart for details).     #Generalized abdominal pain #Rebound abdominal tenderness #History of pancreatitis Patient is a 49 year old male with history of pancreatitis, hypertension and hyperlipidemia presenting with abdominal pain with concern for peritoneal signs.  Vital signs are currently stable and he is afebrile however he is borderline tachycardic with some dry mucous membranes.  Given history of pancreatitis, recent clinical history and exam there is concern for pancreatitis.  However also  given concern for peritoneal signs, appendicitis, biliary or other intra-abdominal pathology is on the differential..  Given these findings, I discussed with patient that he needs further evaluation in the emergency department today with likely CT scan and blood work in order to determine completely what is going on.  Stressed the importance going for further evaluation and that we do not clearly know this is pancreatitis and also stressed that pancreatitis itself requires further work-up that we cannot supply here in the urgent care.  Patient verbalizes he will report to the [facility] emergency department for further evaluation following discharge from the urgent care.  Patient has stable vital signs I do believe he is able to self report to the emergency department.   Final Clinical Impressions(s) / UC Diagnoses   Final diagnoses:  Generalized abdominal pain  Rebound abdominal tenderness  History of pancreatitis     Discharge Instructions     Given you symptoms and exam today you need to be further evaluated for your abdominal pain in the Emergency Department today. Please report to the [facility] Emergency Department following discharge from Urgent Care   ED Prescriptions    None     PDMP not reviewed this encounter.   Hermelinda Medicus, PA-C 08/23/19 [phone]

## 2019-08-23 NOTE — ED Triage Notes (Signed)
Patient c/o mid abdominal pain x 2 days. Patient denies any N/V/D. 

## 2019-08-23 NOTE — Discharge Instructions (Addendum)
Given you symptoms and exam today you need to be further evaluated for your abdominal pain in the Emergency Department today. Please report to the [facility] Emergency Department following discharge from Urgent Care

## 2019-08-23 NOTE — ED Triage Notes (Addendum)
Pt states his abdominal swelling ( Pancreases issue ) . Pt states he has had this problem before. This has been going on 3 days.

## 2019-08-26 ENCOUNTER — Other Ambulatory Visit: Payer: Self-pay

## 2019-08-26 ENCOUNTER — Emergency Department (HOSPITAL_COMMUNITY): Payer: Commercial Managed Care - PPO

## 2019-08-26 ENCOUNTER — Encounter (HOSPITAL_COMMUNITY): Payer: Self-pay

## 2019-08-26 ENCOUNTER — Emergency Department (HOSPITAL_COMMUNITY)
Admission: EM | Admit: 2019-08-26 | Discharge: 2019-08-26 | Disposition: A | Discharge status: Home / Self Care | Payer: Commercial Managed Care - PPO | Attending: Emergency Medicine | Admitting: Emergency Medicine

## 2019-08-26 DIAGNOSIS — M25569 Pain in unspecified knee: Secondary | ICD-10-CM

## 2019-08-26 DIAGNOSIS — M25562 Pain in left knee: Secondary | ICD-10-CM | POA: Insufficient documentation

## 2019-08-26 DIAGNOSIS — Z79899 Other long term (current) drug therapy: Secondary | ICD-10-CM | POA: Diagnosis not present

## 2019-08-26 MED ORDER — DICLOFENAC SODIUM 1 % EX GEL
2.0000 g | Freq: Two times a day (BID) | CUTANEOUS | Status: DC | PRN
  Filled 2019-08-26: qty 100

## 2019-08-26 MED ORDER — DICLOFENAC SODIUM 1 % EX GEL: 2.0000 g | Freq: Four times a day (QID) | CUTANEOUS | 0 refills | Status: DC | PRN

## 2019-08-26 NOTE — ED Provider Notes (Signed)
[facility] DEPT Provider Note   CSN: [id number] Arrival date & time: 08/26/19  0720     History Chief Complaint  Patient presents with  . Knee Pain    Darryl Elliott is a 49 y.o. male.  The history is provided by the patient and medical records. No language interpreter was used.  Knee Pain  Darryl Elliott is a 49 y.o. male who presents to the Emergency Department complaining of knee pain. He presents the emergency department complaining of two days of pain to the left knee. No reports of injuries to the knee. He was seen in the emergency department three days ago for abdominal pain and was diagnosed with pancreatitis and prescribed Percocet. He thinks the pain pills made his knee start hurting. No reports of injuries. He states that his abdominal pain is resolved. No numbness, weakness, fevers, chills. No history of prior similar symptoms. 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 Pain is worse with movement and standing.    Past Medical History:  Diagnosis Date  . Pancreatitis     There are no problems to display for this patient.   History reviewed. No pertinent surgical history.     Family History  Family history unknown: Yes    Social History   Tobacco Use  . Smoking status: Never Smoker  . Smokeless tobacco: Never Used  Substance Use Topics  . Alcohol use: Yes  . Drug use: No    Home Medications Prior to Admission medications   Medication Sig Start Date End Date Taking? Authorizing Provider  HYDROmorphone (DILAUDID) 4 MG tablet Take 0.5-1 tablets (2-4 mg total) by mouth every 4 (four) hours as needed (for pain). Patient not taking: Reported on 08/23/2019 03/11/13   Molpus, Jenny Reichmann, MD  ibuprofen (ADVIL,MOTRIN) 800 MG tablet Take 1 tablet (800 mg total) by mouth 3 (three) times daily. 07/15/15   Barrett, Lahoma Crocker, PA-C  methocarbamol (ROBAXIN) 500 MG tablet Can take  up to 1-2 tabs every 6 hours PRN PAIN Patient not taking: Reported on 08/23/2019 02/22/16   Pisciotta, Elmyra Ricks, PA-C  ondansetron (ZOFRAN ODT) 8 MG disintegrating tablet Take 1 tablet (8 mg total) by mouth every 8 (eight) hours as needed for nausea or vomiting. 8mg  ODT q4 hours prn nausea Patient not taking: Reported on 08/23/2019 03/11/13   Molpus, Jenny Reichmann, MD  ondansetron (ZOFRAN) 4 MG tablet Take 1 tablet (4 mg total) by mouth every 6 (six) hours for 20 doses. 08/23/19 08/28/19  Curatolo, Adam, DO  oxyCODONE (ROXICODONE) 5 MG immediate release tablet Take 1 tablet (5 mg total) by mouth every 6 (six) hours as needed for up to 10 doses for severe pain or breakthrough pain. 08/23/19   Curatolo, Adam, DO  predniSONE (STERAPRED UNI-PAK 21 TAB) 10 MG (21) TBPK tablet Take 6 tabs by mouth daily  for 2 days, then 5 tabs for 2 days, then 4 tabs for 2 days, then 3 tabs for 2 days, 2 tabs for 2 days, then 1 tab by mouth daily for 2 days Patient not taking: Reported on 08/23/2019 05/20/17   Isla Pence, MD  valACYclovir (VALTREX) 1000 MG tablet Take 1 tablet (1,000 mg total) by mouth 3 (three) times daily. 05/20/17   Isla Pence, MD    Allergies    Patient has no known allergies.  Review of Systems   Review of Systems  All other systems reviewed and are negative.  Physical Exam Updated Vital Signs BP (!) 157/108 (BP Location: Right Arm)   Pulse 88   Temp 98.1 F (36.7 C) (Oral)   Resp 20   Ht 6\' 3"  (1.905 m)   Wt 117 kg   SpO2 99%   BMI 32.25 kg/m   Physical Exam Vitals and nursing note reviewed.  Constitutional:      Appearance: He is well-developed.  HENT:     Head: Normocephalic and atraumatic.  Cardiovascular:     Rate and Rhythm: Normal rate and regular rhythm.     Heart sounds: No murmur.  Pulmonary:     Effort: Pulmonary effort is normal. No respiratory distress.     Breath sounds: Normal breath sounds.  Abdominal:     Palpations: Abdomen is soft.     Tenderness: There is no  abdominal tenderness. There is no guarding or rebound.  Musculoskeletal:     Comments: 2+ DP pulses bilaterally. There is tenderness to palpation over the left knee, predominantly over the superior aspect of the patella. There is no significant edema, erythema. No palpable joint effusion. Flexion extension is intact at the knee.  Skin:    General: Skin is warm and dry.  Neurological:     Mental Status: He is alert and oriented to person, place, and time.  Psychiatric:        Behavior: Behavior normal.     ED Results / Procedures / Treatments   Labs (all labs ordered are listed, but only abnormal results are displayed) Labs Reviewed - No data to display  EKG None  Radiology DG Knee Complete 4 Views Left  Result Date: 08/26/2019 CLINICAL DATA:  LEFT knee pain and numbness for 2 days, no known trauma EXAM: LEFT KNEE - COMPLETE 4+ VIEW COMPARISON:  None FINDINGS: Question mild osseous demineralization. Joint space narrowing and spur formation greatest at medial compartment. No acute fracture, dislocation or bone destruction. Small patellar enthesophyte at quadriceps tendon insertion. No joint effusion. IMPRESSION: Degenerative changes LEFT knee. No acute osseous abnormalities. Electronically Signed   By: 08/28/2019 M.D.   On: 08/26/2019 08:36    Procedures Procedures (including critical care time)  Medications Ordered in ED Medications  diclofenac Sodium (VOLTAREN) 1 % topical gel 2 g (has no administration in time range)    ED Course  I have reviewed the triage vital signs and the nursing notes.  Pertinent labs & imaging results that were available during my care of the patient were reviewed by me and considered in my medical decision making (see chart for details).    MDM Rules/Calculators/A&P                     Patient here for evaluation of atraumatic left knee pain. Examination not consistent with septic arthritis, gouty arthritis, cellulitis. Imaging personally reviewed,  no evidence of acute fracture. Discussed with patient home care for knee pain. Recommend symptomatic treatment with topical Voltaren gel and knee sleeve for comfort. Will not prescribe systemic anti-inflammatories at this time due to his recent episode of pancreatitis. Outpatient follow-up discussed.  Final Clinical Impression(s) / ED Diagnoses Final diagnoses:  Knee pain  Acute pain of left knee    Rx / DC Orders ED Discharge Orders    None       08/28/2019, MD 08/26/19 1210

## 2019-08-26 NOTE — ED Notes (Signed)
Pt didn't want to wait any longer for medication to come from pharmacy. Pt left ambulating out of ED.

## 2019-08-26 NOTE — ED Triage Notes (Addendum)
Patient c/o left knee pain and numbness x 2 days. Patient was seen 3 days ago for abdominal pain. Patienat states the oxycodone prescribed for abdominal pain is not working for his  Left knee.

## 2020-10-28 ENCOUNTER — Other Ambulatory Visit: Payer: Self-pay

## 2020-10-28 ENCOUNTER — Emergency Department (HOSPITAL_BASED_OUTPATIENT_CLINIC_OR_DEPARTMENT_OTHER)
Admission: EM | Admit: 2020-10-28 | Discharge: 2020-10-28 | Disposition: A | Discharge status: Home / Self Care | Payer: Commercial Managed Care - PPO | Attending: Emergency Medicine | Admitting: Emergency Medicine

## 2020-10-28 ENCOUNTER — Encounter (HOSPITAL_BASED_OUTPATIENT_CLINIC_OR_DEPARTMENT_OTHER): Payer: Self-pay | Admitting: Emergency Medicine

## 2020-10-28 DIAGNOSIS — I10 Essential (primary) hypertension: Secondary | ICD-10-CM | POA: Diagnosis not present

## 2020-10-28 DIAGNOSIS — H5710 Ocular pain, unspecified eye: Secondary | ICD-10-CM

## 2020-10-28 DIAGNOSIS — H538 Other visual disturbances: Secondary | ICD-10-CM | POA: Diagnosis present

## 2020-10-28 MED ORDER — FLUORESCEIN SODIUM 1 MG OP STRP
1.0000 | ORAL_STRIP | Freq: Once | OPHTHALMIC | Status: AC
  Administered 2020-10-28: 1 via OPHTHALMIC
  Filled 2020-10-28: qty 1

## 2020-10-28 MED ORDER — AMLODIPINE BESYLATE 5 MG PO TABS: 5.0000 mg | ORAL_TABLET | Freq: Every day | ORAL | 0 refills | Status: AC

## 2020-10-28 MED ORDER — TETRACAINE HCL 0.5 % OP SOLN
2.0000 [drp] | Freq: Once | OPHTHALMIC | Status: AC
  Administered 2020-10-28: 2 [drp] via OPHTHALMIC
  Filled 2020-10-28: qty 4

## 2020-10-28 NOTE — Discharge Instructions (Addendum)
Please follow-up with your primary doctor in the next month for recheck of blood pressure and to refill your medicine

## 2020-10-28 NOTE — ED Notes (Signed)
Pt states that he does not have his glasses with him and cannot see the visual acuity board

## 2020-10-28 NOTE — ED Provider Notes (Signed)
[facility] EMERGENCY DEPT Provider Note   CSN: [id number] Arrival date & time: 10/28/20  0210     History Chief Complaint  Patient presents with   Hypertension   Blurred Vision    Darryl Elliott is a 50 y.o. male.  The history is provided by the patient and the spouse.  Eye Pain This is a new problem. The current episode started 1 to 2 hours ago. The problem occurs constantly. The problem has not changed since onset.Pertinent negatives include no chest pain, no abdominal pain and no headaches. Nothing aggravates the symptoms. Nothing relieves the symptoms.  Patient reports he woke up several hours ago with bilateral eye soreness.  He reports his eyes felt irritated.  He first thought that he had something in his eyes.  No fevers or vomiting.  No headache.  No focal weakness.  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  Patient reports history of hypertension but is not taking medications    Past Medical History:  Diagnosis Date   Pancreatitis      Family History  Family history unknown: Yes    Social History   Tobacco Use   Smoking status: Never   Smokeless tobacco: Never  Vaping Use   Vaping Use: Never used  Substance Use Topics   Alcohol use: Yes   Drug use: No    Home Medications Prior to Admission medications   Medication Sig Start Date End Date Taking? Authorizing Provider  diclofenac Sodium (VOLTAREN) 1 % GEL Apply 2 g topically 4 (four) times daily as needed. 08/26/19   Tilden Fossa, MD  oxyCODONE (ROXICODONE) 5 MG immediate release tablet Take 1 tablet (5 mg total) by mouth every 6 (six) hours as needed for up to 10 doses for severe pain or breakthrough pain. 08/23/19   Curatolo, Adam, DO  valACYclovir (VALTREX) 1000 MG tablet Take 1 tablet (1,000 mg total) by mouth 3 (three) times daily. 05/20/17   Jacalyn Lefevre, MD     Allergies    Patient has no known allergies.  Review of Systems   Review of Systems  Constitutional:  Negative for fever.  Eyes:  Positive for pain.  Cardiovascular:  Negative for chest pain.  Gastrointestinal:  Negative for abdominal pain.  Neurological:  Negative for dizziness, speech difficulty (T), weakness, numbness and headaches.  All other systems reviewed and are negative.  Physical Exam Updated Vital Signs BP (!) 152/91   Pulse 65   Temp 97.7 F (36.5 C) (Oral)   Resp 18   Ht 1.88 m (6\' 2" )   Wt 127 kg   SpO2 95%   BMI 35.95 kg/m   Physical Exam CONSTITUTIONAL: Well developed/well nourished HEAD: Normocephalic/atraumatic EYES: EOMI/PERRL, no nystagmus,no ptosis, no proptosis, no corneal haziness, no foreign bodies, no evidence of UV keratitis, no abrasion ENMT: Mucous membranes moist NECK: supple no meningeal signs CV: S1/S2 noted, no murmurs/rubs/gallops noted LUNGS: Lungs are clear to auscultation bilaterally, no apparent distress ABDOMEN: soft, nontender, no rebound or guarding GU:no cva tenderness NEURO:Awake/alert, face symmetric, no arm or leg drift is noted Equal 5/5 strength with shoulder abduction, elbow flex/extension, wrist flex/extension in upper extremities and equal hand grips bilaterally Equal 5/5 strength with hip flexion,knee flex/extension, foot dorsi/plantar flexion Cranial nerves 3/4/5/6/10/13/08/11/12 tested and intact Sensation to light touch intact in all extremities EXTREMITIES: pulses normal, full ROM SKIN:  warm, color normal PSYCH: no abnormalities of mood noted  ED Results / Procedures / Treatments   Labs (all labs ordered are listed, but only abnormal results are displayed) Labs Reviewed - No data to display  EKG None  Radiology No results found.  Procedures Procedures   Medications Ordered in ED Medications  fluorescein ophthalmic strip 1 strip (has no administration in time range)  tetracaine (PONTOCAINE) 0.5 %  ophthalmic solution 2 drop (has no administration in time range)    ED Course  I have reviewed the triage vital signs and the nursing notes.     MDM Rules/Calculators/A&P                           Patient presents because he woke up tonight with bilateral eye soreness and his wife checked his blood pressure and it was elevated.  He is now feeling improved.  He points to both orbits as source of pain but no visual loss  denies any focal headache at this time.  No focal weakness.  His blood pressure is improving.  He is unable to fully participate in visual acuity as he was not wearing his glasses However he is well-appearing, watching television and playing games on his phone. Low suspicion for acute neurologic emergency at this time.  We will restart his blood pressure medicines and advise follow-up with his PCP  Final Clinical Impression(s) / ED Diagnoses Final diagnoses:  Primary hypertension  Soreness of eye    Rx / DC Orders ED Discharge Orders     None        Zadie Rhine, MD 10/28/20 [phone]

## 2020-10-28 NOTE — ED Triage Notes (Addendum)
  Patient comes in with hypertension and blurry vision.  Has history of HTN and prescribed medications but has not taken them in years.  Patient states he woke up around 0130 with eye irritation and had the blurry vision.  Wife checked his BP and it was 230/100.  BP on arrival 176/92.  Patient has no complaints other than bilateral eye burning, and feeling like something is stuck in his eye.  NIH - 0.  No headache, speech issues, or syncope.  Pain 7/10.  Patient states he is a Psychologist, occupational and wears mask long periods of time.

## 2021-05-27 ENCOUNTER — Other Ambulatory Visit: Payer: Self-pay

## 2021-05-27 ENCOUNTER — Encounter (HOSPITAL_BASED_OUTPATIENT_CLINIC_OR_DEPARTMENT_OTHER): Payer: Self-pay | Admitting: *Deleted

## 2021-05-27 ENCOUNTER — Emergency Department (HOSPITAL_BASED_OUTPATIENT_CLINIC_OR_DEPARTMENT_OTHER)
Admission: EM | Admit: 2021-05-27 | Discharge: 2021-05-27 | Disposition: A | Discharge status: Home / Self Care | Payer: Commercial Managed Care - PPO | Attending: Emergency Medicine | Admitting: Emergency Medicine

## 2021-05-27 DIAGNOSIS — I10 Essential (primary) hypertension: Secondary | ICD-10-CM | POA: Insufficient documentation

## 2021-05-27 DIAGNOSIS — M25572 Pain in left ankle and joints of left foot: Secondary | ICD-10-CM | POA: Insufficient documentation

## 2021-05-27 HISTORY — DX: Essential (primary) hypertension: I10

## 2021-05-27 MED ORDER — KETOROLAC TROMETHAMINE 30 MG/ML IJ SOLN
30.0000 mg | Freq: Once | INTRAMUSCULAR | Status: AC
  Administered 2021-05-27: 30 mg via INTRAMUSCULAR
  Filled 2021-05-27: qty 1

## 2021-05-27 MED ORDER — OXYCODONE-ACETAMINOPHEN 5-325 MG PO TABS
1.0000 | ORAL_TABLET | Freq: Once | ORAL | Status: AC
  Administered 2021-05-27: 1 via ORAL
  Filled 2021-05-27: qty 1

## 2021-05-27 MED ORDER — NAPROXEN 500 MG PO TABS: 500.0000 mg | ORAL_TABLET | Freq: Two times a day (BID) | ORAL | 0 refills | Status: AC

## 2021-05-27 NOTE — ED Triage Notes (Addendum)
Pt states he woke up with left ankle pain. Denies any injury. Hurts to bear weight. Has not taking anything for pain. Left ankle swollen on exam. Pt. Is hypertensive, but states he is not currently taking his b/p meds.

## 2021-05-27 NOTE — ED Provider Notes (Signed)
[facility] EMERGENCY DEPT Provider Note   CSN: [id number] Arrival date & time: 05/27/21  7681     History  Chief Complaint  Patient presents with   Ankle Pain    Darryl Elliott is a 51 y.o. male.  HPI     This is a 51 year old male who presents with ankle pain.  Patient reports that he woke up this morning with ankle pain.  Denies any known trauma.  No history of gout or arthritis that he knows of.  Reports that his pain is worse with ambulation.  He has not taken anything for the pain.  Denies any fevers.  Does report that he drank some beer and had barbecue this weekend.  Home Medications Prior to Admission medications   Medication Sig Start Date End Date Taking? Authorizing Provider  naproxen (NAPROSYN) 500 MG tablet Take 1 tablet (500 mg total) by mouth 2 (two) times daily. 05/27/21  Yes Jiayi Lengacher, Mayer Masker, MD  amLODipine (NORVASC) 5 MG tablet Take 1 tablet (5 mg total) by mouth daily. 10/28/20   Zadie Rhine, MD      Allergies    Patient has no known allergies.    Review of Systems   Review of Systems  Constitutional:  Negative for fever.  Musculoskeletal:        Ankle pain  All other systems reviewed and are negative.  Physical Exam Updated Vital Signs BP (!) 185/96 (BP Location: Right Arm)    Pulse 79    Temp 97.7 F (36.5 C) (Oral)    Resp 20    Ht 1.88 m (6\' 2" )    Wt 131.5 kg    SpO2 96%    BMI 37.23 kg/m  Physical Exam Vitals and nursing note reviewed.  Constitutional:      Appearance: He is well-developed. He is obese.  HENT:     Head: Normocephalic and atraumatic.     Mouth/Throat:     Mouth: Mucous membranes are moist.  Cardiovascular:     Rate and Rhythm: Normal rate and regular rhythm.  Pulmonary:     Effort: Pulmonary effort is normal. No respiratory distress.  Musculoskeletal:     Cervical back: Neck supple.     Comments: Tenderness to palpation at the ankle joint, pain with range of motion, slight swelling noted, no  overlying skin changes or erythema, 2+ DP pulse, neurovascular intact distally  Lymphadenopathy:     Cervical: No cervical adenopathy.  Skin:    General: Skin is warm and dry.  Neurological:     Mental Status: He is alert and oriented to person, place, and time.  Psychiatric:        Mood and Affect: Mood normal.    ED Results / Procedures / Treatments   Labs (all labs ordered are listed, but only abnormal results are displayed) Labs Reviewed - No data to display  EKG None  Radiology No results found.  Procedures Procedures    Medications Ordered in ED Medications  ketorolac (TORADOL) 30 MG/ML injection 30 mg (has no administration in time range)  oxyCODONE-acetaminophen (PERCOCET/ROXICET) 5-325 MG per tablet 1 tablet (has no administration in time range)    ED Course/ Medical Decision Making/ A&P                           Medical Decision Making Risk Prescription drug management.   This patient presents to the ED for concern of left ankle pain, this involves  an extensive number of treatment options, and is a complaint that carries with it a high risk of complications and morbidity.  The differential diagnosis includes arthritis, gout, less likely fracture or sprain given atraumatic history  MDM:    Patient presents with left atraumatic ankle pain.  He is nontoxic and vital signs are reassuring.  History and physical exam are most suggestive of likely inflammatory arthritis such as gout.  He has no signs or symptoms of septic arthritis.  Highly doubt fracture or sprain given atraumatic history.  He is neurovascularly intact.  Recommend anti-inflammatories.  Discussed avoiding alcohol and pork products. (Labs, imaging)  Labs: I Ordered, and personally interpreted labs.  The pertinent results include: None  Imaging Studies ordered: I ordered imaging studies including none I independently visualized and interpreted imaging. I agree with the radiologist  interpretation  Additional history obtained from N/A.  External records from outside source obtained and reviewed including N/A  Critical Interventions: IM Toradol, Percocet  Consultations: I requested consultation with the NA,  and discussed lab and imaging findings as well as pertinent plan - they recommend: N/A  Cardiac Monitoring: The patient was maintained on a cardiac monitor.  I personally viewed and interpreted the cardiac monitored which showed an underlying rhythm of: N/A  Reevaluation: After the interventions noted above, I reevaluated the patient and found that they have :improved   Considered admission for: N/A  Social Determinants of Health: Lives independently  Disposition: Discharge home with naproxen  Co morbidities that complicate the patient evaluation  Past Medical History:  Diagnosis Date   Hypertension    Pancreatitis      Medicines Meds ordered this encounter  Medications   ketorolac (TORADOL) 30 MG/ML injection 30 mg   oxyCODONE-acetaminophen (PERCOCET/ROXICET) 5-325 MG per tablet 1 tablet   naproxen (NAPROSYN) 500 MG tablet    Sig: Take 1 tablet (500 mg total) by mouth 2 (two) times daily.    Dispense:  30 tablet    Refill:  0    I have reviewed the patients home medicines and have made adjustments as needed  Problem List / ED Course: Problem List Items Addressed This Visit   None Visit Diagnoses     Acute left ankle pain    -  Primary                   Final Clinical Impression(s) / ED Diagnoses Final diagnoses:  Acute left ankle pain    Rx / DC Orders ED Discharge Orders          Ordered    naproxen (NAPROSYN) 500 MG tablet  2 times daily        05/27/21 0643              Shon Baton, MD 05/27/21 [phone]

## 2021-05-27 NOTE — Discharge Instructions (Addendum)
You were seen today for ankle pain.  You likely have an inflammatory arthritis called gout.  Take naproxen twice daily for pain.  Wear good fitting shoes.  Avoid alcohol and pork products.

## 2021-11-24 IMAGING — CR DG KNEE COMPLETE 4+V*L*
5 series · 5 of 5 positions shown · non-contrast
Comparison: None

CLINICAL DATA: LEFT knee pain and numbness for 2 days, no known
trauma

EXAM:
LEFT KNEE - COMPLETE 4+ VIEW

[t knee ap left]
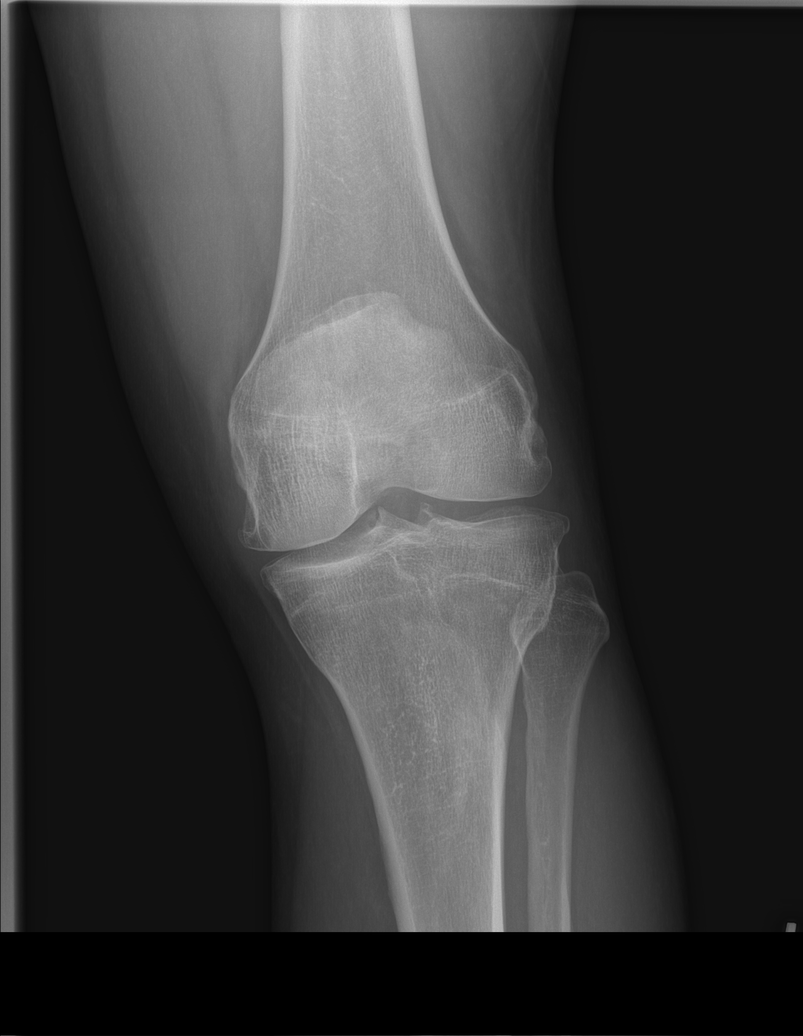

[t knee obl left (1 of 2)]
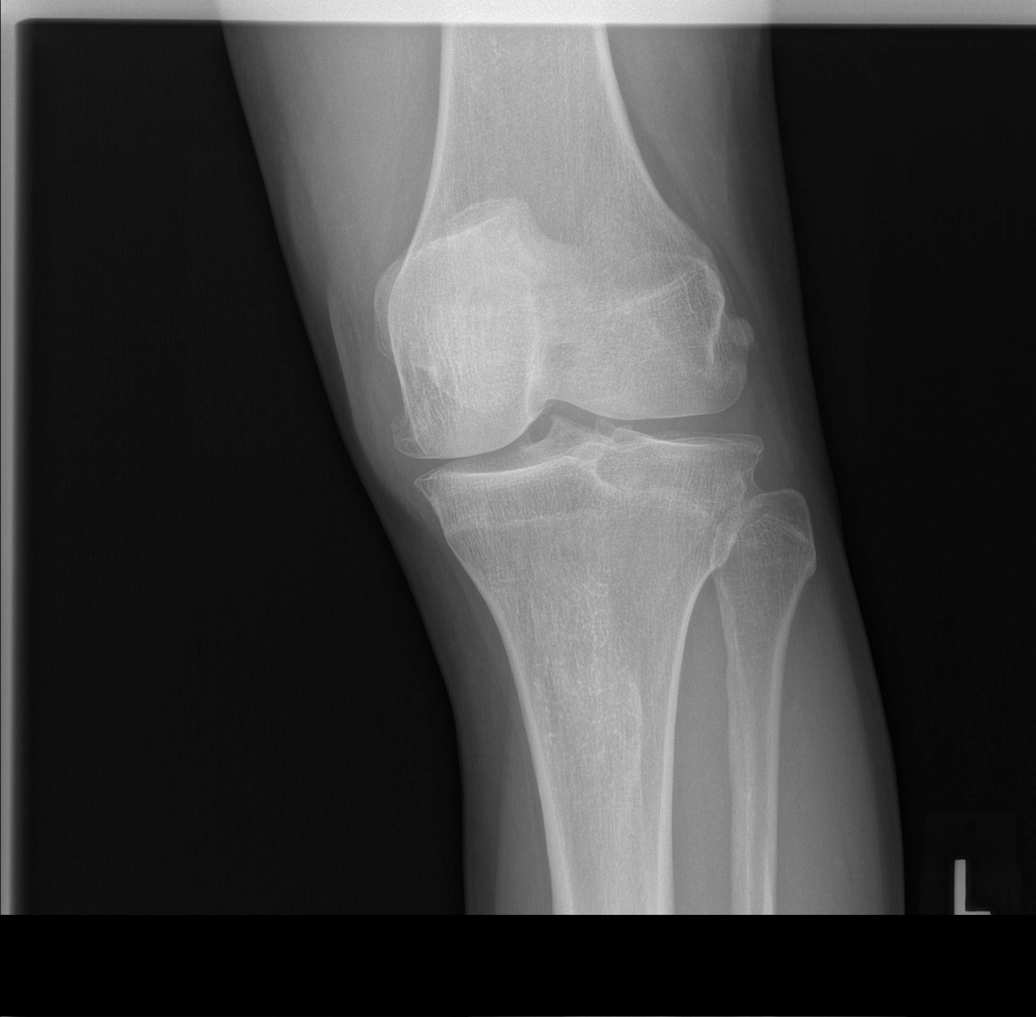

[t knee obl left (2 of 2)]
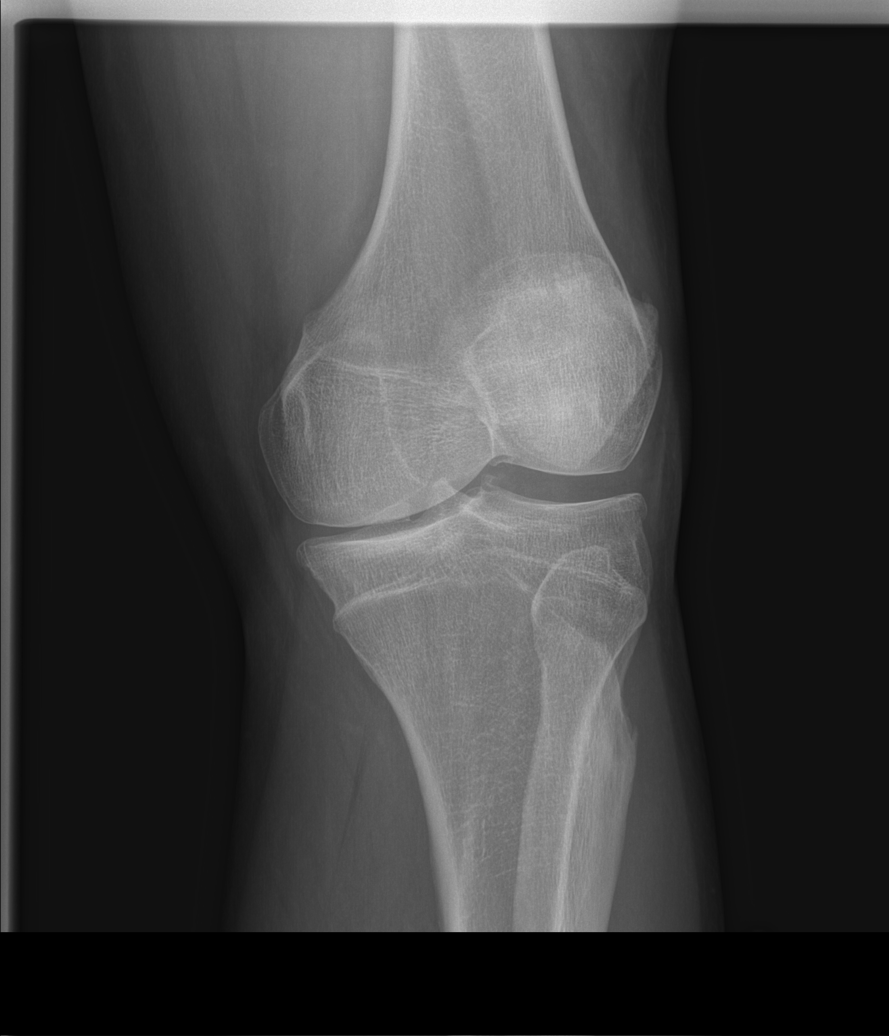

[t knee lat left (1 of 2)]
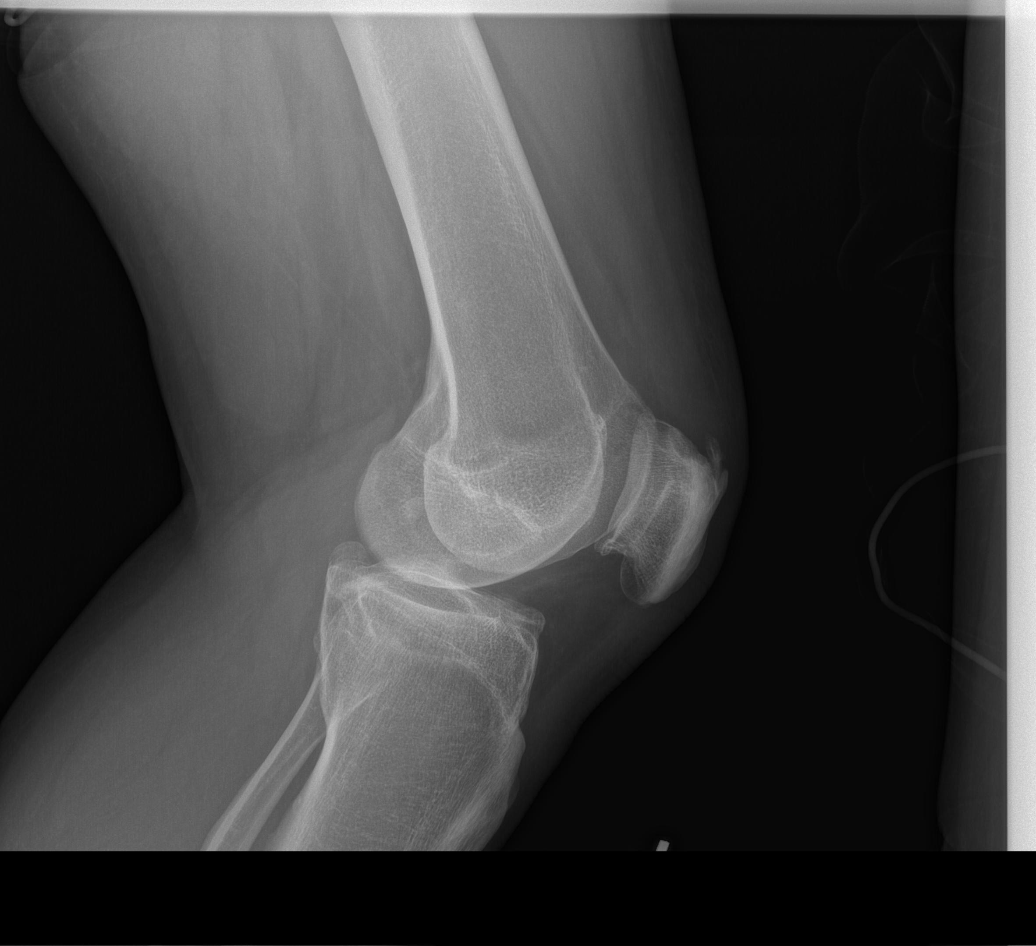

[t knee lat left (2 of 2)]
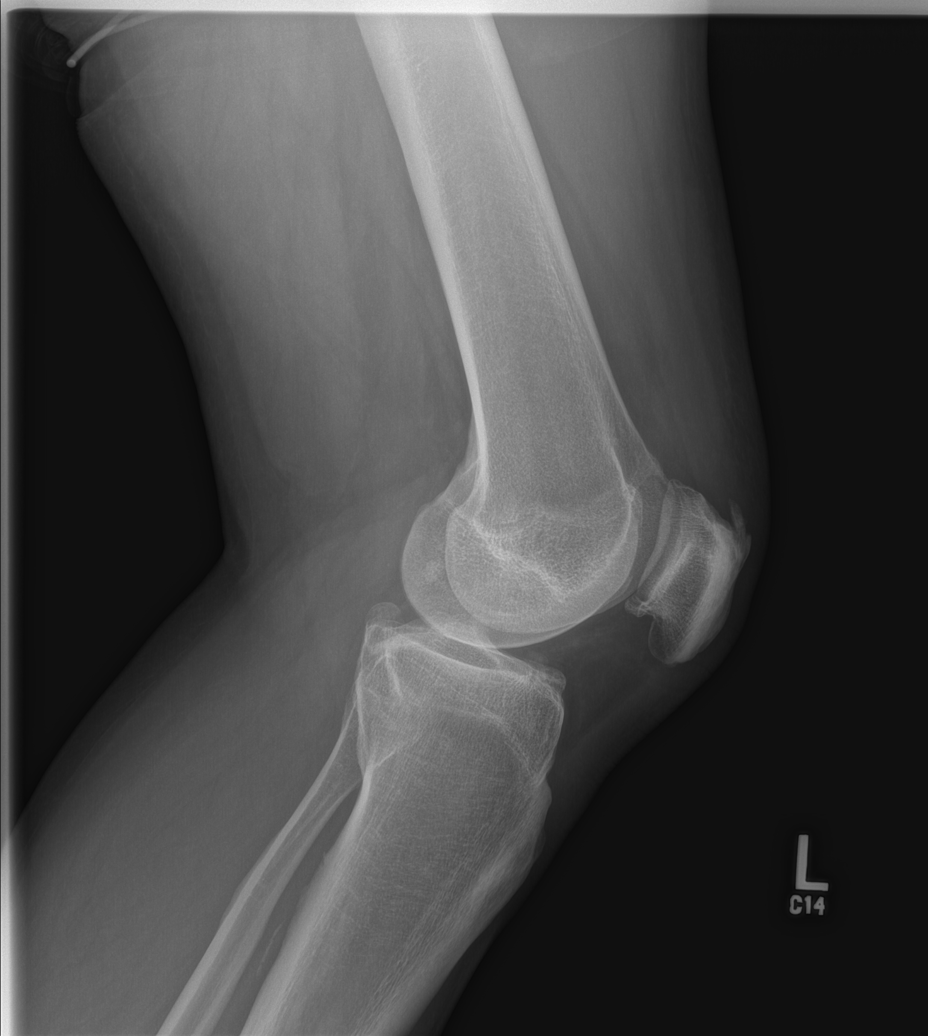

[5 of 5 positions shown; findings below may reference images not displayed]

FINDINGS: Question mild osseous demineralization.

Joint space narrowing and spur formation greatest at medial
compartment.

No acute fracture, dislocation or bone destruction.

Small patellar enthesophyte at quadriceps tendon insertion.

No joint effusion.
IMPRESSION: Degenerative changes LEFT knee.

No acute osseous abnormalities.
